# Patient Record
Sex: Female | Born: 1955 | Race: Black or African American | Hispanic: No | Smoking: Former smoker
Health system: Southern US, Community
[De-identification: ages and names within clinical notes are randomized; demographics above are authoritative.]

## PROBLEM LIST (undated history)

## (undated) ENCOUNTER — Ambulatory Visit: Admission: EM | Payer: Medicare PPO

## (undated) DIAGNOSIS — L0390 Cellulitis, unspecified: Secondary | ICD-10-CM

## (undated) DIAGNOSIS — M1991 Primary osteoarthritis, unspecified site: Secondary | ICD-10-CM

## (undated) DIAGNOSIS — F419 Anxiety disorder, unspecified: Secondary | ICD-10-CM

## (undated) DIAGNOSIS — F32A Depression, unspecified: Secondary | ICD-10-CM

## (undated) DIAGNOSIS — A048 Other specified bacterial intestinal infections: Secondary | ICD-10-CM

## (undated) DIAGNOSIS — M1990 Unspecified osteoarthritis, unspecified site: Secondary | ICD-10-CM

## (undated) DIAGNOSIS — I10 Essential (primary) hypertension: Secondary | ICD-10-CM

## (undated) DIAGNOSIS — J45909 Unspecified asthma, uncomplicated: Secondary | ICD-10-CM

## (undated) DIAGNOSIS — E119 Type 2 diabetes mellitus without complications: Secondary | ICD-10-CM

## (undated) DIAGNOSIS — I2510 Atherosclerotic heart disease of native coronary artery without angina pectoris: Secondary | ICD-10-CM

## (undated) DIAGNOSIS — J302 Other seasonal allergic rhinitis: Secondary | ICD-10-CM

## (undated) DIAGNOSIS — D649 Anemia, unspecified: Secondary | ICD-10-CM

## (undated) DIAGNOSIS — J301 Allergic rhinitis due to pollen: Secondary | ICD-10-CM

## (undated) DIAGNOSIS — K219 Gastro-esophageal reflux disease without esophagitis: Secondary | ICD-10-CM

## (undated) DIAGNOSIS — E7800 Pure hypercholesterolemia, unspecified: Secondary | ICD-10-CM

## (undated) HISTORY — PX: APPENDECTOMY: SHX54

## (undated) HISTORY — PX: ABDOMINAL HYSTERECTOMY: SHX81

## (undated) HISTORY — PX: CHOLECYSTECTOMY: SHX55

## (undated) HISTORY — PX: BREAST CYST EXCISION: SHX579

## (undated) HISTORY — PX: LIPOMA EXCISION: SHX5283

---

## 2004-01-14 ENCOUNTER — Other Ambulatory Visit: Payer: Self-pay

## 2005-02-24 ENCOUNTER — Emergency Department: Payer: Self-pay | Admitting: Unknown Physician Specialty

## 2005-03-21 ENCOUNTER — Ambulatory Visit: Payer: Self-pay | Admitting: Physician Assistant

## 2005-03-30 ENCOUNTER — Ambulatory Visit: Payer: Self-pay

## 2005-07-02 ENCOUNTER — Emergency Department: Payer: Self-pay | Admitting: Emergency Medicine

## 2005-07-02 ENCOUNTER — Other Ambulatory Visit: Payer: Self-pay

## 2005-07-04 ENCOUNTER — Ambulatory Visit: Payer: Self-pay | Admitting: Family Medicine

## 2005-08-10 ENCOUNTER — Emergency Department: Payer: Self-pay | Admitting: Unknown Physician Specialty

## 2005-08-10 ENCOUNTER — Ambulatory Visit: Payer: Self-pay | Admitting: Unknown Physician Specialty

## 2005-08-10 ENCOUNTER — Other Ambulatory Visit: Payer: Self-pay

## 2005-08-15 ENCOUNTER — Ambulatory Visit: Payer: Self-pay | Admitting: General Surgery

## 2005-08-17 ENCOUNTER — Ambulatory Visit: Payer: Self-pay | Admitting: General Surgery

## 2007-08-28 ENCOUNTER — Ambulatory Visit: Payer: Self-pay | Admitting: Family Medicine

## 2008-04-26 ENCOUNTER — Emergency Department: Payer: Self-pay | Admitting: Emergency Medicine

## 2008-04-26 ENCOUNTER — Ambulatory Visit: Payer: Self-pay | Admitting: Internal Medicine

## 2010-06-15 ENCOUNTER — Ambulatory Visit: Payer: Self-pay | Admitting: Unknown Physician Specialty

## 2011-08-08 ENCOUNTER — Ambulatory Visit: Payer: Self-pay | Admitting: Family Medicine

## 2012-08-14 ENCOUNTER — Ambulatory Visit: Payer: Self-pay | Admitting: Family Medicine

## 2012-08-16 ENCOUNTER — Ambulatory Visit: Payer: Self-pay | Admitting: Family Medicine

## 2013-12-31 ENCOUNTER — Ambulatory Visit: Payer: Self-pay | Admitting: Family Medicine

## 2015-02-04 ENCOUNTER — Ambulatory Visit: Payer: Self-pay

## 2015-08-30 ENCOUNTER — Ambulatory Visit (INDEPENDENT_AMBULATORY_CARE_PROVIDER_SITE_OTHER)
Admission: EM | Admit: 2015-08-30 | Discharge: 2015-08-30 | Disposition: A | Discharge status: Home / Self Care | Payer: BC Managed Care – PPO | Source: Home / Self Care | Attending: Family Medicine | Admitting: Family Medicine

## 2015-08-30 ENCOUNTER — Encounter: Payer: Self-pay | Admitting: Emergency Medicine

## 2015-08-30 ENCOUNTER — Observation Stay
Admission: EM | Admit: 2015-08-30 | Discharge: 2015-08-31 | Disposition: A | Discharge status: Home / Self Care | Payer: BC Managed Care – PPO | Attending: Internal Medicine | Admitting: Internal Medicine

## 2015-08-30 ENCOUNTER — Emergency Department: Payer: BC Managed Care – PPO

## 2015-08-30 ENCOUNTER — Other Ambulatory Visit: Payer: Self-pay

## 2015-08-30 DIAGNOSIS — Z886 Allergy status to analgesic agent status: Secondary | ICD-10-CM | POA: Diagnosis not present

## 2015-08-30 DIAGNOSIS — R079 Chest pain, unspecified: Secondary | ICD-10-CM

## 2015-08-30 DIAGNOSIS — F419 Anxiety disorder, unspecified: Secondary | ICD-10-CM | POA: Diagnosis not present

## 2015-08-30 DIAGNOSIS — E1165 Type 2 diabetes mellitus with hyperglycemia: Secondary | ICD-10-CM | POA: Insufficient documentation

## 2015-08-30 DIAGNOSIS — Z791 Long term (current) use of non-steroidal anti-inflammatories (NSAID): Secondary | ICD-10-CM | POA: Diagnosis not present

## 2015-08-30 DIAGNOSIS — Z87891 Personal history of nicotine dependence: Secondary | ICD-10-CM | POA: Diagnosis not present

## 2015-08-30 DIAGNOSIS — IMO0002 Reserved for concepts with insufficient information to code with codable children: Secondary | ICD-10-CM | POA: Insufficient documentation

## 2015-08-30 DIAGNOSIS — Z809 Family history of malignant neoplasm, unspecified: Secondary | ICD-10-CM | POA: Diagnosis not present

## 2015-08-30 DIAGNOSIS — I209 Angina pectoris, unspecified: Secondary | ICD-10-CM | POA: Insufficient documentation

## 2015-08-30 DIAGNOSIS — I10 Essential (primary) hypertension: Secondary | ICD-10-CM

## 2015-08-30 DIAGNOSIS — E119 Type 2 diabetes mellitus without complications: Secondary | ICD-10-CM | POA: Diagnosis not present

## 2015-08-30 DIAGNOSIS — E785 Hyperlipidemia, unspecified: Secondary | ICD-10-CM | POA: Insufficient documentation

## 2015-08-30 DIAGNOSIS — Z8639 Personal history of other endocrine, nutritional and metabolic disease: Secondary | ICD-10-CM | POA: Diagnosis not present

## 2015-08-30 DIAGNOSIS — I25119 Atherosclerotic heart disease of native coronary artery with unspecified angina pectoris: Secondary | ICD-10-CM | POA: Diagnosis not present

## 2015-08-30 DIAGNOSIS — Z9049 Acquired absence of other specified parts of digestive tract: Secondary | ICD-10-CM | POA: Diagnosis not present

## 2015-08-30 DIAGNOSIS — R9431 Abnormal electrocardiogram [ECG] [EKG]: Secondary | ICD-10-CM

## 2015-08-30 DIAGNOSIS — Z8249 Family history of ischemic heart disease and other diseases of the circulatory system: Secondary | ICD-10-CM

## 2015-08-30 DIAGNOSIS — Z79899 Other long term (current) drug therapy: Secondary | ICD-10-CM | POA: Diagnosis not present

## 2015-08-30 DIAGNOSIS — Z90710 Acquired absence of both cervix and uterus: Secondary | ICD-10-CM | POA: Diagnosis not present

## 2015-08-30 DIAGNOSIS — Z91013 Allergy to seafood: Secondary | ICD-10-CM | POA: Diagnosis not present

## 2015-08-30 DIAGNOSIS — E876 Hypokalemia: Secondary | ICD-10-CM | POA: Diagnosis not present

## 2015-08-30 HISTORY — DX: Anxiety disorder, unspecified: F41.9

## 2015-08-30 HISTORY — DX: Essential (primary) hypertension: I10

## 2015-08-30 HISTORY — DX: Pure hypercholesterolemia, unspecified: E78.00

## 2015-08-30 HISTORY — DX: Type 2 diabetes mellitus without complications: E11.9

## 2015-08-30 HISTORY — DX: Atherosclerotic heart disease of native coronary artery without angina pectoris: I25.10

## 2015-08-30 LAB — CBC
HCT: 38 % (ref 35.0–47.0)
HEMATOCRIT: 37.7 % (ref 35.0–47.0)
HEMOGLOBIN: 12.3 g/dL (ref 12.0–16.0)
HEMOGLOBIN: 12.4 g/dL (ref 12.0–16.0)
MCH: 28.2 pg (ref 26.0–34.0)
MCH: 28.3 pg (ref 26.0–34.0)
MCHC: 32.5 g/dL (ref 32.0–36.0)
MCHC: 32.9 g/dL (ref 32.0–36.0)
MCV: 86.9 fL (ref 80.0–100.0)
MCV: 86.2 fL (ref 80.0–100.0)
Platelets: 246 10*3/uL (ref 150–440)
Platelets: 253 10*3/uL (ref 150–440)
RBC: 4.38 MIL/uL (ref 3.80–5.20)
RBC: 4.37 MIL/uL (ref 3.80–5.20)
RDW: 15.1 % — AB (ref 11.5–14.5)
RDW: 15.4 % — AB (ref 11.5–14.5)
WBC: 8.9 10*3/uL (ref 3.6–11.0)
WBC: 9.9 10*3/uL (ref 3.6–11.0)

## 2015-08-30 LAB — BASIC METABOLIC PANEL
Anion gap: 8 (ref 5–15)
BUN: 13 mg/dL (ref 6–20)
CALCIUM: 9.4 mg/dL (ref 8.9–10.3)
CO2: 29 mmol/L (ref 22–32)
CREATININE: 0.79 mg/dL (ref 0.44–1.00)
Chloride: 103 mmol/L (ref 101–111)
GFR calc Af Amer: >60 mL/min (ref >60)
GFR calc non Af Amer: >60 mL/min (ref >60)
GLUCOSE: 95 mg/dL (ref 65–99)
Potassium: 3.5 mmol/L (ref 3.5–5.1)
Sodium: 140 mmol/L (ref 135–145)

## 2015-08-30 LAB — CREATININE, SERUM: Creatinine, Ser: 0.76 mg/dL (ref 0.44–1.00)

## 2015-08-30 LAB — TROPONIN I: Troponin I: <0.03 ng/mL (ref <0.031)

## 2015-08-30 LAB — GLUCOSE, CAPILLARY: Glucose-Capillary: 156 mg/dL — ABNORMAL HIGH (ref 65–99)

## 2015-08-30 MED ORDER — POLYETHYLENE GLYCOL 3350 17 G PO PACK: 17.0000 g | PACK | Freq: Every day | ORAL | Status: DC | PRN

## 2015-08-30 MED ORDER — ISOSORBIDE MONONITRATE ER 30 MG PO TB24
30.0000 mg | ORAL_TABLET | Freq: Every day | ORAL | Status: DC
  Administered 2015-08-30: 30 mg via ORAL
  Filled 2015-08-30 (×2): qty 1

## 2015-08-30 MED ORDER — MORPHINE SULFATE (PF) 2 MG/ML IV SOLN: 2.0000 mg | INTRAVENOUS | Status: DC | PRN

## 2015-08-30 MED ORDER — NAPROXEN SODIUM 550 MG PO TABS
550.0000 mg | ORAL_TABLET | Freq: Two times a day (BID) | ORAL | Status: DC | PRN
  Filled 2015-08-30: qty 1

## 2015-08-30 MED ORDER — NITROGLYCERIN 0.4 MG SL SUBL
0.4000 mg | SUBLINGUAL_TABLET | SUBLINGUAL | Status: DC | PRN
Start: 2015-08-30 — End: 2015-08-31

## 2015-08-30 MED ORDER — ONDANSETRON HCL 4 MG PO TABS: 4.0000 mg | ORAL_TABLET | Freq: Four times a day (QID) | ORAL | Status: DC | PRN

## 2015-08-30 MED ORDER — SODIUM CHLORIDE 0.9 % IJ SOLN: 3.0000 mL | INTRAMUSCULAR | Status: DC | PRN

## 2015-08-30 MED ORDER — ATORVASTATIN CALCIUM 20 MG PO TABS
40.0000 mg | ORAL_TABLET | Freq: Every day | ORAL | Status: DC
  Administered 2015-08-30: 40 mg via ORAL
  Filled 2015-08-30: qty 2

## 2015-08-30 MED ORDER — ENOXAPARIN SODIUM 40 MG/0.4ML ~~LOC~~ SOLN: 40.0000 mg | Freq: Every day | SUBCUTANEOUS | Status: DC

## 2015-08-30 MED ORDER — NAPROXEN 500 MG PO TABS
500.0000 mg | ORAL_TABLET | Freq: Two times a day (BID) | ORAL | Status: DC | PRN
  Administered 2015-08-30: 500 mg via ORAL
  Filled 2015-08-30 (×2): qty 1

## 2015-08-30 MED ORDER — CLOPIDOGREL BISULFATE 75 MG PO TABS
75.0000 mg | ORAL_TABLET | Freq: Every day | ORAL | Status: DC
  Administered 2015-08-30: 75 mg via ORAL
  Filled 2015-08-30 (×2): qty 1

## 2015-08-30 MED ORDER — ONDANSETRON HCL 4 MG/2ML IJ SOLN: 4.0000 mg | Freq: Four times a day (QID) | INTRAMUSCULAR | Status: DC | PRN

## 2015-08-30 MED ORDER — INSULIN ASPART 100 UNIT/ML ~~LOC~~ SOLN: 0.0000 [IU] | Freq: Three times a day (TID) | SUBCUTANEOUS | Status: DC

## 2015-08-30 MED ORDER — ACETAMINOPHEN 650 MG RE SUPP: 650.0000 mg | Freq: Four times a day (QID) | RECTAL | Status: DC | PRN

## 2015-08-30 MED ORDER — INSULIN ASPART 100 UNIT/ML ~~LOC~~ SOLN: 0.0000 [IU] | Freq: Every day | SUBCUTANEOUS | Status: DC

## 2015-08-30 MED ORDER — ALPRAZOLAM 0.5 MG PO TABS: 0.5000 mg | ORAL_TABLET | Freq: Three times a day (TID) | ORAL | Status: DC | PRN

## 2015-08-30 MED ORDER — ACETAMINOPHEN 325 MG PO TABS: 650.0000 mg | ORAL_TABLET | Freq: Four times a day (QID) | ORAL | Status: DC | PRN

## 2015-08-30 NOTE — ED Notes (Signed)
MD at bedside. 

## 2015-08-30 NOTE — ED Notes (Signed)
Examined by Dr. Thurmond Butts. To [facility] by POV for further evaluation. Dr. Thurmond Butts called report to [facility] RN-Charge at [facility] ER

## 2015-08-30 NOTE — ED Provider Notes (Signed)
CSN: [id number]     Arrival date & time 08/30/15  1215 History   First MD Initiated Contact with Patient 08/30/15 1246     Chief Complaint  Patient presents with  . Chest Pain   reports yesterday on the way to church she started having left sided chest pain anterior that radiated to her neck. She became diaphoretic and short of breath. She didn't decide to go back home which she rested and started feeling somewhat better. She still felt drained throughout the day yesterday and today. Still having chest pain off and on today as well as fatigue because she contacted her provider's office and they suggested going to an urgent care erroneously or the ED.   (Consider location/radiation/quality/duration/timing/severity/associated sxs/prior Treatment) Patient is a 59 y.o. female presenting with chest pain. The history is provided by the patient and the spouse. No language interpreter was used.  Chest Pain Pain location:  Substernal area and L lateral chest Pain quality: sharp   Pain radiates to:  Neck Pain radiates to the back: no   Pain severity:  Severe Onset quality:  Sudden Timing:  Intermittent Progression:  Partially resolved Chronicity:  New Context: at rest and stress   Relieved by:  Rest Ineffective treatments:  Rest Associated symptoms: heartburn and shortness of breath   Associated symptoms: no abdominal pain, no altered mental status and no cough   Risk factors: diabetes mellitus, high cholesterol, hypertension and obesity     Nurse's notes were reviewed. Patient has risk factors from diabetes borderline, elevated cholesterol and is just been started on cluster medicine, hypertension and a strong family history heart disease. Both her mother and father were diabetic and had heart disease and hypertension in her brother died at 47 from a massive heart attack. She is allergic to aspirin.  Past Medical History  Diagnosis Date  . Coronary artery disease   . Diabetes mellitus without  complication   . Hypertension   . Anxiety   . Hypercholesteremia    Past Surgical History  Procedure Laterality Date  . Abdominal hysterectomy    . Appendectomy    . Cholecystectomy     Family History  Problem Relation Age of Onset  . Cancer Mother   . Cancer Father    Social History  Substance Use Topics  . Smoking status: Former Games developer  . Smokeless tobacco: None  . Alcohol Use: No   OB History    No data available     Review of Systems  Respiratory: Positive for shortness of breath. Negative for cough.   Cardiovascular: Positive for chest pain.  Gastrointestinal: Positive for heartburn. Negative for abdominal pain.  All other systems reviewed and are negative.   Allergies  Aspirin and Shellfish allergy  Home Medications   Prior to Admission medications   Medication Sig Start Date End Date Taking? Authorizing Provider  ALPRAZolam Prudy Feeler) 0.5 MG tablet Take 0.5 mg by mouth at bedtime as needed for anxiety.   Yes Historical Provider, MD  atenolol (TENORMIN) 25 MG tablet Take by mouth daily.   Yes Historical Provider, MD  atorvastatin (LIPITOR) 20 MG tablet Take 20 mg by mouth daily.   Yes Historical Provider, MD  metFORMIN (GLUCOPHAGE) 500 MG tablet Take 500 mg by mouth 2 (two) times daily with a meal.   Yes Historical Provider, MD   Meds Ordered and Administered this Visit  Medications - No data to display  Pulse 53  Temp(Src) 96.9 F (36.1 C) (Tympanic)  Resp 16  Ht  (1.6 m)  Wt 236 lb (107.049 kg)  BMI 41.82 kg/m2  SpO2 97% No data found.   Physical Exam  Constitutional: She is oriented to person, place, and time. She appears well-developed and well-nourished.  HENT:  Head: Normocephalic and atraumatic.  Eyes: Conjunctivae are normal. Pupils are equal, round, and reactive to light.  Neck: Normal range of motion. Neck supple. No tracheal deviation present. No thyromegaly present.  Cardiovascular: Regular rhythm, S1 normal, S2 normal, normal heart  sounds and intact distal pulses.  Bradycardia present.   No murmur heard. Pulmonary/Chest: Effort normal and breath sounds normal. She exhibits tenderness.    Mild chest wall tenderness is present no radiation to the neck occurs with palpation of the chest wall.  Abdominal: Soft. She exhibits no distension. There is no tenderness.  Musculoskeletal: Normal range of motion.  Neurological: She is alert and oriented to person, place, and time.  Skin: Skin is warm and dry. No erythema.  Psychiatric: She has a normal mood and affect.  Vitals reviewed.   ED Course  Procedures (including critical care time)  Labs Review Labs Reviewed - No data to display  Imaging Review No results found.   Visual Acuity Review  Right Eye Distance:   Left Eye Distance:   Bilateral Distance:    Right Eye Near:   Left Eye Near:    Bilateral Near:     ED ECG REPORT   Date: 08/30/2015  EKG Time: 1:31 PM  Rate: 52  Rhythm: sinus bradycardia,  normal EKG, normal sinus rhythm, unchanged from previous tracings, sinus bradycardia  Axis: -15  Intervals:none  ST&T Change: Nonspecific ST changes   Narrative Interpretation:  abnormal EKG with nonspecific ST changes.            MDM  No diagnosis found.   Due to risk factors especially family history and diabetes even though she does have some costochondritis of expected to her and her husband that she will need at least one set cardiac enzymes and more than likely a second set 46 hours later. At her risk factors she should be evaluated in the emergency room. Offered EMS transport but she declined and would rather go by private vehicle with her husband driving. Discussed with Baird Lyons at [facility] ED and informed them of her transfer. Should also be noted patient is allergic to aspirin and no aspirin treatment was initiated.  Hassan Rowan, MD 08/30/15 1341

## 2015-08-30 NOTE — ED Notes (Signed)
Pt presents with left sided chest  Pain started yesterday, denies any shortness. Pt states she got diaphoretic and nausea at time of episode. No acute distress noted at this time.

## 2015-08-30 NOTE — ED Notes (Signed)
Started yesterday with sudden onset with left anterior chest pain radiating to left side of face. States "was really sweaty" and slight SOB. States pain has been fairly constant since. Initially 9/10 and now 1/10.

## 2015-08-30 NOTE — Discharge Instructions (Signed)
Chest Pain (Nonspecific) °It is often hard to give a specific diagnosis for the cause of chest pain. There is always a chance that your pain could be related to something serious, such as a heart attack or a blood clot in the lungs. You need to follow up with your health care provider for further evaluation. °CAUSES  °· Heartburn. °· Pneumonia or bronchitis. °· Anxiety or stress. °· Inflammation around your heart (pericarditis) or lung (pleuritis or pleurisy). °· A blood clot in the lung. °· A collapsed lung (pneumothorax). It can develop suddenly on its own (spontaneous pneumothorax) or from trauma to the chest. °· Shingles infection (herpes zoster virus). °The chest wall is composed of bones, muscles, and cartilage. Any of these can be the source of the pain. °· The bones can be bruised by injury. °· The muscles or cartilage can be strained by coughing or overwork. °· The cartilage can be affected by inflammation and become sore (costochondritis). °DIAGNOSIS  °Lab tests or other studies may be needed to find the cause of your pain. Your health care provider may have you take a test called an ambulatory electrocardiogram (ECG). An ECG records your heartbeat patterns over a 24-hour period. You may also have other tests, such as: °· Transthoracic echocardiogram (TTE). During echocardiography, sound waves are used to evaluate how blood flows through your heart. °· Transesophageal echocardiogram (TEE). °· Cardiac monitoring. This allows your health care provider to monitor your heart rate and rhythm in real time. °· Holter monitor. This is a portable device that records your heartbeat and can help diagnose heart arrhythmias. It allows your health care provider to track your heart activity for several days, if needed. °· Stress tests by exercise or by giving medicine that makes the heart beat faster. °TREATMENT  °· Treatment depends on what may be causing your chest pain. Treatment may include: °· Acid blockers for  heartburn. °· Anti-inflammatory medicine. °· Pain medicine for inflammatory conditions. °· Antibiotics if an infection is present. °· You may be advised to change lifestyle habits. This includes stopping smoking and avoiding alcohol, caffeine, and chocolate. °· You may be advised to keep your head raised (elevated) when sleeping. This reduces the chance of acid going backward from your stomach into your esophagus. °Most of the time, nonspecific chest pain will improve within 2-3 days with rest and mild pain medicine.  °HOME CARE INSTRUCTIONS  °· If antibiotics were prescribed, take them as directed. Finish them even if you start to feel better. °· For the next few days, avoid physical activities that bring on chest pain. Continue physical activities as directed. °· Do not use any tobacco products, including cigarettes, chewing tobacco, or electronic cigarettes. °· Avoid drinking alcohol. °· Only take medicine as directed by your health care provider. °· Follow your health care provider's suggestions for further testing if your chest pain does not go away. °· Keep any follow-up appointments you made. If you do not go to an appointment, you could develop lasting (chronic) problems with pain. If there is any problem keeping an appointment, call to reschedule. °SEEK MEDICAL CARE IF:  °· Your chest pain does not go away, even after treatment. °· You have a rash with blisters on your chest. °· You have a fever. °SEEK IMMEDIATE MEDICAL CARE IF:  °· You have increased chest pain or pain that spreads to your arm, neck, jaw, back, or abdomen. °· You have shortness of breath. °· You have an increasing cough, or you cough   up blood. °· You have severe back or abdominal pain. °· You feel nauseous or vomit. °· You have severe weakness. °· You faint. °· You have chills. °This is an emergency. Do not wait to see if the pain will go away. Get medical help at once. Call your local emergency services (911 in [place] Do not drive  yourself to the hospital. °MAKE SURE YOU:  °· Understand these instructions. °· Will watch your condition. °· Will get help right away if you are not doing well or get worse. °Document Released: 08/30/2005 Document Revised: 11/25/2013 Document Reviewed: 06/25/2008 °Exit[employer]® Patient Information ©2015 [employer]. This information is not intended to replace advice given to you by your health care provider. Make sure you discuss any questions you have with your health care provider. ° °Hypertension °Hypertension is another name for high blood pressure. High blood pressure forces your heart to work harder to pump blood. A blood pressure reading has two numbers, which includes a higher number over a lower number (example: 110/72). °HOME CARE  °· Have your blood pressure rechecked by your doctor. °· Only take medicine as told by your doctor. Follow the directions carefully. The medicine does not work as well if you skip doses. Skipping doses also puts you at risk for problems. °· Do not smoke. °· Monitor your blood pressure at home as told by your doctor. °GET HELP IF: °· You think you are having a reaction to the medicine you are taking. °· You have repeat headaches or feel dizzy. °· You have puffiness (swelling) in your ankles. °· You have trouble with your vision. °GET HELP RIGHT AWAY IF:  °· You get a very bad headache and are confused. °· You feel weak, numb, or faint. °· You get chest or belly (abdominal) pain. °· You throw up (vomit). °· You cannot breathe very well. °MAKE SURE YOU:  °· Understand these instructions. °· Will watch your condition. °· Will get help right away if you are not doing well or get worse. °Document Released: 05/08/2008 Document Revised: 11/25/2013 Document Reviewed: 09/12/2013 °ExitCare® Patient Information ©2015 [employer]. This information is not intended to replace advice given to you by your health care provider. Make sure you discuss any questions you have with your health care  provider. ° °

## 2015-08-30 NOTE — ED Provider Notes (Signed)
Time Seen: Approximately 1710 I have reviewed the triage notes  Chief Complaint: Chest Pain   History of Present Illness: Vanessa Hampton is a 59 y.o. female who presents with substernal chest pain which started yesterday patient states she was on her way to church and had sudden onset of substernal chest discomfort described as sharp with some mild radiation to left side of her jaw. Date she did have some nausea, diaphoresis. She denies any shortness of breath or pleuritic component. Pain was very intense for approximately 15 minutes and started yesterday at approximately 10 AM. She states she's noticed a dull ache behind her chest but otherwise has no complaints at this point. She went to an urgent care and is referred here to the emergency department for further evaluation. She states she had a cardiac evaluation with a stress test which was negative done remotely.   Past Medical History  Diagnosis Date  . Coronary artery disease   . Diabetes mellitus without complication   . Hypertension   . Anxiety   . Hypercholesteremia     There are no active problems to display for this patient.   Past Surgical History  Procedure Laterality Date  . Abdominal hysterectomy    . Appendectomy    . Cholecystectomy      Past Surgical History  Procedure Laterality Date  . Abdominal hysterectomy    . Appendectomy    . Cholecystectomy      Current Outpatient Rx  Name  Route  Sig  Dispense  Refill  . ALPRAZolam (XANAX) 0.5 MG tablet   Oral   Take 0.5 mg by mouth at bedtime as needed for anxiety.         Marland Kitchen atenolol (TENORMIN) 25 MG tablet   Oral   Take by mouth daily.         Marland Kitchen atorvastatin (LIPITOR) 20 MG tablet   Oral   Take 20 mg by mouth daily.         . metFORMIN (GLUCOPHAGE) 500 MG tablet   Oral   Take 500 mg by mouth 2 (two) times daily with a meal.           Allergies:  Aspirin and Shellfish allergy  Family History: Family History  Problem Relation Age of  Onset  . Cancer Mother   . Cancer Father    she states she had a brother that had a heart attack at age 6  Social History: Social History  Substance Use Topics  . Smoking status: Former Games developer  . Smokeless tobacco: None  . Alcohol Use: No     Review of Systems:   10 point review of systems was performed and was otherwise negative:  Constitutional: No fever Eyes: No visual disturbances ENT: No sore throat, ear pain Cardiac: Mild dull ache in the chest Respiratory: No shortness of breath, wheezing, or stridor Abdomen: No abdominal pain, no vomiting, No diarrhea Endocrine: No weight loss, No night sweats Extremities: Mild bilateral peripheral edema, no cyanosis Skin: No rashes, easy bruising Neurologic: No focal weakness, trouble with speech or swollowing Urologic: No dysuria, Hematuria, or urinary frequency   Physical Exam:  ED Triage Vitals  Enc Vitals Group     BP 08/30/15 1411 143/80 mmHg     Pulse Rate 08/30/15 1411 51     Resp 08/30/15 1411 20     Temp 08/30/15 1411 98.9 F (37.2 C)     Temp Source 08/30/15 1411 Oral     SpO2  08/30/15 1411 99 %     Weight 08/30/15 1411 236 lb (107.049 kg)     Height 08/30/15 1411  (1.6 m)     Head Cir --      Peak Flow --      Pain Score 08/30/15 1719 0     Pain Loc --      Pain Edu? --      Excl. in GC? --     General: Awake , Alert , and Oriented times 3; GCS 15 Head: Normal cephalic , atraumatic Eyes: Pupils equal , round, reactive to light Nose/Throat: No nasal drainage, patent upper airway without erythema or exudate.  Neck: Supple, Full range of motion, No anterior adenopathy or palpable thyroid masses Lungs: Clear to ascultation without wheezes , rhonchi, or rales Heart: Regular rate, regular rhythm without murmurs , gallops , or rubs Abdomen: Soft, non tender without rebound, guarding , or rigidity; bowel sounds positive and symmetric in all 4 quadrants. No organomegaly .        Extremities: Bilateral  nonpitting circumferential edema. No clubbing or cyanosis Neurologic: normal ambulation, Motor symmetric without deficits, sensory intact Skin: warm, dry, no rashes No obvious reproducible chest wall pain with direct palpation  Labs:   All laboratory work was reviewed including any pertinent negatives or positives listed below:  Labs Reviewed  CBC - Abnormal; Notable for the following:    RDW 15.1 (*)    All other components within normal limits  BASIC METABOLIC PANEL  TROPONIN I   preliminary laboratory work was reviewed and troponin at this time is negative  EKG: ED ECG REPORT I, Jennye Moccasin, the attending physician, personally viewed and interpreted this ECG.  Date: 08/30/2015 EKG Time: 1407 Rate: 51 Rhythm: normal sinus rhythm QRS Axis: Left axis deviation Intervals: normal ST/T Wave abnormalities: Nonspecific ST and T-wave abnormalities  Conduction Disutrbances: none Narrative Interpretation: No obvious acute ischemic changes    Radiology:  EXAM: CHEST 2 VIEW  COMPARISON: Report of chest x-ray dated 02/29/2004  FINDINGS: There is borderline cardiomegaly with slight tortuosity of the thoracic aorta. Pulmonary vascularity is normal. Lungs are clear. No effusions. No osseous abnormalities.  IMPRESSION: Slight cardiomegaly.      I personally reviewed the radiologic studies   ED Course: Differential includes all life-threatening causes for chest pain. This includes but is not exclusive to acute coronary syndrome, aortic dissection, pulmonary embolism, cardiac tamponade, community-acquired pneumonia, pericarditis, musculoskeletal chest wall pain, etc.  Given the patient's presentation and objective findings at this point she does not appear to be having acute myocardial infarction. However she does have multiple risk factors for cardiovascular disease including a strong family history concerning her brother. Her EKG is not completely normal with some  nonspecific T-wave abnormalities and I reviewed the case with the on call cardiologist. He felt the conservative thing would be to watch the patient overnight and do objective study tomorrow morning. The patient is currently essentially asymptomatic and hemodynamically stable. She has a reaction to aspirin which includes hives so none was administered at this time. Assessment:  Acute unspecified chest pain   Final Clinical Impression:   Final diagnoses:  Chest pain, unspecified chest pain type     Plan:  Inpatient management I reviewed the case with the hospitalist team, further disposition and management depends upon their evaluation.            Jennye Moccasin, MD 08/30/15 [phone]

## 2015-08-30 NOTE — H&P (Signed)
[facility] -  at [facility]   PATIENT NAME: Vanessa Hampton    MR#:  [id number]  DATE OF BIRTH:  [birth year]  DATE OF ADMISSION:  08/30/2015  PRIMARY CARE PHYSICIAN: Gardiner Rhyme, MD   REQUESTING/REFERRING PHYSICIAN: Dr. Lacretia Nicks  CHIEF COMPLAINT:   Chief Complaint  Patient presents with  . Chest Pain    HISTORY OF PRESENT ILLNESS:  Vanessa Hampton  is a 59 y.o. female with a known history of hypertension, diabetes, anxiety, hyperlipidemia, history of coronary artery disease, who presents to the hospital due to chest pain/pressure. Patient says she developed some chest pain while driving to church yesterday. The pain was associated with some diaphoresis, dizziness but no shortness of breath, nausea, vomiting. Patient says her pain did radiate to the jaw at times and has not really gone away since yesterday. She therefore came to the ER for further evaluation. Patient does have history of having similar chest pains a few years back for which she had a stress test done which was negative. Given her significant family history for heart disease she is being admitted to the hospital for further evaluation for chest pain.  PAST MEDICAL HISTORY:   Past Medical History  Diagnosis Date  . Coronary artery disease   . Diabetes mellitus without complication   . Hypertension   . Anxiety   . Hypercholesteremia     PAST SURGICAL HISTORY:   Past Surgical History  Procedure Laterality Date  . Abdominal hysterectomy    . Appendectomy    . Cholecystectomy      SOCIAL HISTORY:   Social History  Substance Use Topics  . Smoking status: Former Games developer  . Smokeless tobacco: Not on file  . Alcohol Use: No    FAMILY HISTORY:   Family History  Problem Relation Age of Onset  . Cancer Mother   . Cancer Father     DRUG ALLERGIES:   Allergies  Allergen Reactions  . Aspirin Hives  . Shellfish Allergy Hives    REVIEW OF SYSTEMS:   Review of  Systems  Constitutional: Positive for diaphoresis. Negative for fever and weight loss.  HENT: Negative for congestion, nosebleeds and tinnitus.   Eyes: Negative for blurred vision, double vision and redness.  Respiratory: Negative for cough, hemoptysis and shortness of breath.   Cardiovascular: Positive for chest pain. Negative for orthopnea, leg swelling and PND.  Gastrointestinal: Positive for nausea. Negative for vomiting, abdominal pain, diarrhea and melena.  Genitourinary: Negative for dysuria, urgency and hematuria.  Musculoskeletal: Negative for joint pain and falls.  Neurological: Negative for dizziness, tingling, sensory change, focal weakness, seizures, weakness and headaches.  Endo/Heme/Allergies: Negative for polydipsia. Does not bruise/bleed easily.  Psychiatric/Behavioral: Negative for depression and memory loss. The patient is not nervous/anxious.     MEDICATIONS AT HOME:   Prior to Admission medications   Medication Sig Start Date End Date Taking? Authorizing Provider  ALPRAZolam Prudy Feeler) 0.5 MG tablet Take 0.5 mg by mouth 3 (three) times daily as needed for anxiety.    Yes Historical Provider, MD  atenolol-chlorthalidone (TENORETIC) 50-25 MG per tablet Take 1 tablet by mouth daily.   Yes Historical Provider, MD  atorvastatin (LIPITOR) 40 MG tablet Take 40 mg by mouth daily.   Yes Historical Provider, MD  metFORMIN (GLUCOPHAGE) 500 MG tablet Take 500 mg by mouth 2 (two) times daily with a meal.   Yes Historical Provider, MD  naproxen sodium (ANAPROX) 220 MG tablet Take 660 mg by mouth 2 (two)  times daily as needed (for headache).   Yes Historical Provider, MD  polyethylene glycol (MIRALAX / GLYCOLAX) packet Take 17 g by mouth daily as needed for mild constipation.   Yes Historical Provider, MD      VITAL SIGNS:  Blood pressure 157/89, pulse 55, temperature 98.9 F (37.2 C), temperature source Oral, resp. rate 17, height  (1.6 m), weight 107.049 kg (236 lb), SpO2 100  %.  PHYSICAL EXAMINATION:  Physical Exam  GENERAL:  59 y.o.-year-old patient lying in the bed with no acute distress.  EYES: Pupils equal, round, reactive to light and accommodation. No scleral icterus. Extraocular muscles intact.  HEENT: Head atraumatic, normocephalic. Oropharynx and nasopharynx clear. No oropharyngeal erythema, moist oral mucosa  NECK:  Supple, no jugular venous distention. No thyroid enlargement, no tenderness.  LUNGS: Normal breath sounds bilaterally, no wheezing, rales, rhonchi. No use of accessory muscles of respiration.  CARDIOVASCULAR: S1, S2 RRR. No murmurs, rubs, gallops, clicks.  ABDOMEN: Soft, nontender, nondistended. Bowel sounds present. No organomegaly or mass.  EXTREMITIES: No pedal edema, cyanosis, or clubbing. + 2 pedal & radial pulses b/l.   NEUROLOGIC: Cranial nerves II through XII are intact. No focal Motor or sensory deficits appreciated b/l PSYCHIATRIC: The patient is alert and oriented x 3. Good affect.  SKIN: No obvious rash, lesion, or ulcer.   LABORATORY PANEL:   CBC  Recent Labs Lab 08/30/15 1416  WBC 8.9  HGB 12.3  HCT 38.0  PLT 246   ------------------------------------------------------------------------------------------------------------------  Chemistries   Recent Labs Lab 08/30/15 1416  NA 140  K 3.5  CL 103  CO2 29  GLUCOSE 95  BUN 13  CREATININE 0.79  CALCIUM 9.4   ------------------------------------------------------------------------------------------------------------------  Cardiac Enzymes  Recent Labs Lab 08/30/15 1416  TROPONINI <0.03   ------------------------------------------------------------------------------------------------------------------  RADIOLOGY:  Dg Chest 2 View  08/30/2015   CLINICAL DATA:  Left-sided chest pain.  EXAM: CHEST  2 VIEW  COMPARISON:  Report of chest x-ray dated 02/29/2004  FINDINGS: There is borderline cardiomegaly with slight tortuosity of the thoracic aorta.  Pulmonary vascularity is normal. Lungs are clear. No effusions. No osseous abnormalities.  IMPRESSION: Slight cardiomegaly.   Electronically Signed   By: Francene Boyers M.D.   On: 08/30/2015 14:32     IMPRESSION AND PLAN:   59 year old female with past history of diabetes, hypertension, anxiety, history of coronary artery disease who presents to the hospital due to chest pain.  #1 chest pain-patient does have significant risk factors given her history of coronary disease, family history and underlying diabetes and hypertension. Her pain does have typical features of angina although her cardiac markers are negative and her EKG shows no acute ST or T-wave changes. -We'll observe overnight on telemetry, cycle cardiac markers, get a nuclear medicine stress test in a.m. -We'll also consult cardiology. -Patient is allergic to aspirin so I will start on Plavix, PRN nitroglycerin, morphine, atorvastatin.  Follow clinically.   #2 diabetes type 2 without complication-hold metformin. -Place on sliding scale insulin.  #3 hypertension- start on some Imdur.  - We'll hold atenolol given her bradycardia.  #4 anxiety-we'll place on when necessary Xanax.   All the records are reviewed and case discussed with ED provider. Management plans discussed with the patient, family and they are in agreement.  CODE STATUS: Full  TOTAL TIME TAKING CARE OF THIS PATIENT: 45 minutes.    Houston Siren M.D on 08/30/2015 at 6:34 PM  Between 7am to 6pm - Pager - [phone]  After  6pm go to [email or web address] - password [email or web address] [facility]  [address]  Hospitalists  Office  [phone]  CC: Primary care physician; Gardiner Rhyme, MD

## 2015-08-31 ENCOUNTER — Observation Stay (HOSPITAL_BASED_OUTPATIENT_CLINIC_OR_DEPARTMENT_OTHER): Payer: BC Managed Care – PPO

## 2015-08-31 ENCOUNTER — Encounter: Payer: Self-pay | Admitting: Radiology

## 2015-08-31 DIAGNOSIS — E785 Hyperlipidemia, unspecified: Secondary | ICD-10-CM | POA: Insufficient documentation

## 2015-08-31 DIAGNOSIS — I209 Angina pectoris, unspecified: Secondary | ICD-10-CM | POA: Insufficient documentation

## 2015-08-31 DIAGNOSIS — R079 Chest pain, unspecified: Principal | ICD-10-CM | POA: Insufficient documentation

## 2015-08-31 DIAGNOSIS — F419 Anxiety disorder, unspecified: Secondary | ICD-10-CM | POA: Insufficient documentation

## 2015-08-31 DIAGNOSIS — I10 Essential (primary) hypertension: Secondary | ICD-10-CM | POA: Insufficient documentation

## 2015-08-31 DIAGNOSIS — Z791 Long term (current) use of non-steroidal anti-inflammatories (NSAID): Secondary | ICD-10-CM | POA: Insufficient documentation

## 2015-08-31 DIAGNOSIS — E1165 Type 2 diabetes mellitus with hyperglycemia: Secondary | ICD-10-CM

## 2015-08-31 DIAGNOSIS — Z886 Allergy status to analgesic agent status: Secondary | ICD-10-CM | POA: Insufficient documentation

## 2015-08-31 DIAGNOSIS — Z9049 Acquired absence of other specified parts of digestive tract: Secondary | ICD-10-CM | POA: Insufficient documentation

## 2015-08-31 DIAGNOSIS — I25119 Atherosclerotic heart disease of native coronary artery with unspecified angina pectoris: Secondary | ICD-10-CM | POA: Insufficient documentation

## 2015-08-31 DIAGNOSIS — Z91013 Allergy to seafood: Secondary | ICD-10-CM | POA: Insufficient documentation

## 2015-08-31 DIAGNOSIS — E119 Type 2 diabetes mellitus without complications: Secondary | ICD-10-CM | POA: Insufficient documentation

## 2015-08-31 DIAGNOSIS — Z90710 Acquired absence of both cervix and uterus: Secondary | ICD-10-CM | POA: Insufficient documentation

## 2015-08-31 DIAGNOSIS — IMO0002 Reserved for concepts with insufficient information to code with codable children: Secondary | ICD-10-CM | POA: Insufficient documentation

## 2015-08-31 DIAGNOSIS — Z79899 Other long term (current) drug therapy: Secondary | ICD-10-CM | POA: Insufficient documentation

## 2015-08-31 DIAGNOSIS — Z809 Family history of malignant neoplasm, unspecified: Secondary | ICD-10-CM | POA: Insufficient documentation

## 2015-08-31 DIAGNOSIS — E876 Hypokalemia: Secondary | ICD-10-CM | POA: Insufficient documentation

## 2015-08-31 DIAGNOSIS — Z87891 Personal history of nicotine dependence: Secondary | ICD-10-CM | POA: Insufficient documentation

## 2015-08-31 LAB — NM MYOCAR MULTI W/SPECT W/WALL MOTION / EF
CHL CUP NUCLEAR SDS: 0
CHL CUP NUCLEAR SSS: 1
CSEPED: 8 min
CSEPHR: 85 %
Estimated workload: 10.1 METS
LV dias vol: 66 mL
LV sys vol: 23 mL
Peak HR: 139 {beats}/min
Rest HR: 65 {beats}/min
SRS: 5
TID: 1.06

## 2015-08-31 LAB — CBC
HCT: 33.7 % — ABNORMAL LOW (ref 35.0–47.0)
Hemoglobin: 11.3 g/dL — ABNORMAL LOW (ref 12.0–16.0)
MCH: 29.1 pg (ref 26.0–34.0)
MCHC: 33.5 g/dL (ref 32.0–36.0)
MCV: 86.8 fL (ref 80.0–100.0)
PLATELETS: 225 10*3/uL (ref 150–440)
RBC: 3.88 MIL/uL (ref 3.80–5.20)
RDW: 15.4 % — AB (ref 11.5–14.5)
WBC: 7.9 10*3/uL (ref 3.6–11.0)

## 2015-08-31 LAB — LIPID PANEL
CHOL/HDL RATIO: 2.9 ratio
Cholesterol: 125 mg/dL (ref 0–200)
HDL: 43 mg/dL (ref >40)
LDL Cholesterol: 70 mg/dL (ref 0–99)
Triglycerides: 59 mg/dL (ref <150)
VLDL: 12 mg/dL (ref 0–40)

## 2015-08-31 LAB — BASIC METABOLIC PANEL
Anion gap: 7 (ref 5–15)
BUN: 16 mg/dL (ref 6–20)
CALCIUM: 8.8 mg/dL — AB (ref 8.9–10.3)
CHLORIDE: 104 mmol/L (ref 101–111)
CO2: 29 mmol/L (ref 22–32)
CREATININE: 0.73 mg/dL (ref 0.44–1.00)
GFR calc non Af Amer: >60 mL/min (ref >60)
Glucose, Bld: 111 mg/dL — ABNORMAL HIGH (ref 65–99)
Potassium: 3.2 mmol/L — ABNORMAL LOW (ref 3.5–5.1)
SODIUM: 140 mmol/L (ref 135–145)

## 2015-08-31 LAB — GLUCOSE, CAPILLARY
GLUCOSE-CAPILLARY: 117 mg/dL — AB (ref 65–99)
GLUCOSE-CAPILLARY: 131 mg/dL — AB (ref 65–99)

## 2015-08-31 LAB — TROPONIN I

## 2015-08-31 MED ORDER — TECHNETIUM TC 99M SESTAMIBI GENERIC - CARDIOLITE
30.0000 | Freq: Once | INTRAVENOUS | Status: AC | PRN
  Administered 2015-08-31: 32.57 via INTRAVENOUS

## 2015-08-31 MED ORDER — NITROGLYCERIN 0.4 MG SL SUBL: 0.4000 mg | SUBLINGUAL_TABLET | SUBLINGUAL | Status: DC | PRN

## 2015-08-31 MED ORDER — TECHNETIUM TC 99M SESTAMIBI - CARDIOLITE
13.0000 | Freq: Once | INTRAVENOUS | Status: AC | PRN
  Administered 2015-08-31: 08:00:00 12.09 via INTRAVENOUS

## 2015-08-31 MED ORDER — POTASSIUM CHLORIDE CRYS ER 20 MEQ PO TBCR: 40.0000 meq | EXTENDED_RELEASE_TABLET | Freq: Once | ORAL | Status: DC

## 2015-08-31 MED ORDER — ENOXAPARIN SODIUM 40 MG/0.4ML ~~LOC~~ SOLN: 40.0000 mg | Freq: Two times a day (BID) | SUBCUTANEOUS | Status: DC

## 2015-08-31 NOTE — Consult Note (Signed)
Cardiology Consultation Note  Patient ID: Vanessa Hampton, MRN: [id number], DOB/AGE: Jul 26, 1956 59 y.o. Admit date: 08/30/2015   Date of Consult: 08/31/2015 Primary Physician: Gardiner Rhyme, MD Primary Cardiologist: New to [facility]  Chief Complaint: Chest pain Reason for Consult: Chest pain  HPI: 59 y.o. female with h/o DM2, HTN, HLD, and anxiety/panic attacks who presented to [facility] on 9/26 with complaints of 20 minutes of left sided chest pain while driving to church on 5/28. She took an anxiety pill and the pain resolved.   She does not have a cardiologist in the outpatient setting. She previously underwent outpatient stress echo testing on 02/27/2014 in the setting chest pain that showed no evidence of ischemia or infarction, normal stress echo. Incidental note is made of mild to moderate concentric left ventricular hypertrophy. She has had intermittent episodes of chest pain over the past one year that she had attributed to both anxiety and gas. TUMs will "take the edge off." Episodes are not occuring with exertion. She was previously a smoker from age 10 to 78 at 1/2-1 ppd. No alcohol or illicit drug abuse. Brother passed from MI at age 62. Mother passed from lung cancer, also had history of HTN. Father passed from liver cancer.   She developed left sided chest pain while driving to church on 4/13 that was associated with diaphoresis and nausea. There was some radiation to her left jaw. Pain lasted "at least 20 minutes." She turned her car around to come back home an took an anti-anxiety pill, which resolved her pain. She has been pain free since.  Upon her arrival to [facility] she was found to have troponin negative x 3, ECG with sinus bradycardia, 52 bpm, right axis deviation, poor R wave progression, nonspecific st/t changes anterolateral leads, TWI leads III, aVF, CXR with slight cardiomegaly, K+ 3.2, hgb 11.2. Her BP has been in the 140's to 150's systolic with heart rate in the 50's. She  is for treadmill Myoview this morning and is chest pain free.       Past Medical History  Diagnosis Date  . Coronary artery disease   . Diabetes mellitus without complication   . Hypertension   . Anxiety   . Hypercholesteremia       Most Recent Cardiac Studies: Stress echo 02/27/2014:  STRESS ECHOCARDIOGRAPHY    Drugs:None  Target Heart Rate:139 bpmMaximum Predicted Heart Rate: 164 bpm     StageDuration (mm:ss)Heart Rate (bpm)BP  [id number]  STAGE 1 3:00 99 /  STAGE 2 3:00131 /  RECOVERY5:40 77 158/78      Stress Duration:6:00 mm:ss  Max Stress H.R.:131 bpmTarget Heart Rate Achieved: Yes  Maximum workload of 10 METS was achieved during exercise  ___________________________________________________________________________________________  WALL SEGMENT CHANGES    RestStress  Anterior Septum:NormalHyperkinetic  Anterior Wall:NormalHyperkinetic   Lateral Wall:NormalHyperkinetic  Posterior Wall:NormalHyperkinetic  Inferior Wall:NormalHyperkinetic  Inferior Septum:NormalHyperkinetic   Apex:NormalHyperkinetic     Resting EF:>55% (Est.) Stress EF: >55% (Est.)  ___________________________________________________________________________________________  ADDITIONAL FINDINGS    Chest Discomfort:None  Arrhythmia:None  Termination Reason:Fatigue   Adverse Effects:None   Complications:None  ______________________________________________________________________________________    STRESS ECG RESULTS     ECG Results:Normal  _______________________________________________________________________________________    ECHOCARDIOGRAPHIC DESCRIPTIONS    LEFT VENTRICLE  Size:Normal  Contraction:Normal   LV Masses:No Masses   IHK:[id number] LVH  Dias.FxClass:Normal    RIGHT VENTRICLE  Size:Normal Free Wall:Normal  Contraction:Normal RV Masses:No mass    PERICARDIUM   Fluid:No effusion    _______________________________________________________________________________________    [facility]  ECHO and OTHER SPECIAL PROCEDURES   Aortic:No AR No AS     Mitral:No MR No MS  MV Inflow E Vel=nm* MV Annulus E'Vel=nm*  E/E'Ratio=nm*    Tricuspid:No TR No TS    Pulmonary:No PR No PS  _______________________________________________________________________________________  ECHOCARDIOGRAPHIC MEASUREMENTS  2D DIMENSIONS  AORTA Values Normal Range MAIN PAValuesNormal Range  Annulus:nm*[2.1 - 2.5] PA Main:nm* [1.5 - 2.1]  Aorta Sin:nm*[2.7 - 3.3]RIGHT VENTRICLE  ST Junction:nm*[2.3 - 2.9] RV Base:nm* [ < 4.2]  Asc.Aorta:2.3 cm [2.3 - 3.1]RV Mid:nm* [ < 3.5]  LEFT VENTRICLERV Length:nm* [ < 8.6]  LVIDd:4.5 cm [3.9 - 5.3]INFERIOR VENA CAVA  LVIDs:3.2 cmMax. IVC:nm* [ <= 2.1]   FS:29.2 % [> 25] Min. IVC:nm*  SWT:1.5 cm [0.5 - 0.9]------------------  PWT:1.4 cm [0.5 - 0.9]nm* - not measured  LEFT ATRIUM    LA Diam:3.4 cm [2.7 - 3.8]  LA A4C Area:nm*[ < 20]  LA Volume:nm*[22 - 52] ________________________________________________________________________________  INTERPRETATION  Normal Stress Echocardiogram  No evidence of ischemia or infarction.    Incidental note is made of mild to moderate concentric left ventricular hypertrophy.   Surgical History:  Past Surgical History  Procedure Laterality Date  . Abdominal hysterectomy    . Appendectomy    . Cholecystectomy       Home Meds: Prior to Admission medications   Medication Sig Start Date End Date Taking? Authorizing Provider  ALPRAZolam Prudy Feeler) 0.5 MG tablet Take 0.5 mg by mouth 3 (three) times daily as needed for anxiety.    Yes Historical Provider, MD  atenolol-chlorthalidone (TENORETIC) 50-25 MG per tablet Take 1 tablet by mouth daily.   Yes Historical Provider, MD  atorvastatin (LIPITOR) 40 MG tablet Take 40 mg by mouth daily.   Yes Historical Provider, MD  metFORMIN (GLUCOPHAGE) 500 MG tablet Take 500 mg by mouth 2 (two) times daily with a meal.   Yes Historical Provider, MD  naproxen sodium (ANAPROX) 220 MG tablet Take 660 mg by mouth 2 (two) times daily as needed (for headache).   Yes Historical Provider, MD  polyethylene glycol (MIRALAX / GLYCOLAX) packet Take 17 g by mouth daily as needed for mild constipation.   Yes Historical Provider, MD    Inpatient Medications:  . atorvastatin  40 mg Oral QHS  . clopidogrel  75 mg Oral Daily  . enoxaparin (LOVENOX) injection  40 mg Subcutaneous QHS  . insulin aspart  0-15 Units Subcutaneous TID WC  . insulin aspart  0-5 Units Subcutaneous QHS  . isosorbide mononitrate  30 mg Oral Daily      Allergies:  Allergies  Allergen Reactions  . Aspirin Hives  . Shellfish Allergy Hives    Social History   Social History  . Marital Status: Married    Spouse Name: N/A  . Number of Children: N/A  . Years of Education: N/A    Occupational History  . Not on file.   Social History Main Topics  . Smoking status: Former Games developer  . Smokeless tobacco: Never Used  . Alcohol Use: No  . Drug Use: No  . Sexual Activity: Not Currently   Other Topics Concern  . Not on file   Social History Narrative     Family History  Problem Relation Age of Onset  . Cancer Mother   . Cancer Father      Review of Systems: Review of Systems  Constitutional: Positive for malaise/fatigue and diaphoresis. Negative for fever, chills and weight loss.  HENT: Negative for congestion.  Eyes: Negative for discharge and redness.  Respiratory: Positive for shortness of breath. Negative for cough, hemoptysis, sputum production and wheezing.   Cardiovascular: Positive for chest pain and leg swelling. Negative for palpitations, orthopnea, claudication and PND.  Gastrointestinal: Positive for nausea. Negative for heartburn, vomiting and abdominal pain.  Musculoskeletal: Negative for falls.  Skin: Negative for rash.  Neurological: Negative for dizziness, sensory change, speech change, focal weakness, loss of consciousness and weakness.  Endo/Heme/Allergies: Does not bruise/bleed easily.  Psychiatric/Behavioral: Negative for substance abuse. The patient is nervous/anxious.   All other systems reviewed and are negative.    Labs:  Recent Labs  08/30/15 1416 08/30/15 2315 08/31/15 0540  TROPONINI <0.03 <0.03 <0.03   Lab Results  Component Value Date   WBC 7.9 08/31/2015   HGB 11.3* 08/31/2015   HCT 33.7* 08/31/2015   MCV 86.8 08/31/2015   PLT 225 08/31/2015    Recent Labs Lab 08/31/15 0540  NA 140  K 3.2*  CL 104  CO2 29  BUN 16  CREATININE 0.73  CALCIUM 8.8*  GLUCOSE 111*   Lab Results  Component Value Date   CHOL 125 08/31/2015   HDL 43 08/31/2015   LDLCALC 70 08/31/2015   TRIG 59 08/31/2015   No results found for: DDIMER  Radiology/Studies:  Dg Chest 2 View  08/30/2015   CLINICAL DATA:  Left-sided  chest pain.  EXAM: CHEST  2 VIEW  COMPARISON:  Report of chest x-ray dated 02/29/2004  FINDINGS: There is borderline cardiomegaly with slight tortuosity of the thoracic aorta. Pulmonary vascularity is normal. Lungs are clear. No effusions. No osseous abnormalities.  IMPRESSION: Slight cardiomegaly.   Electronically Signed   By: Francene Boyers M.D.   On: 08/30/2015 14:32    EKG: sinus bradycardia, 52 bpm, right axis deviation, poor R wave progression, nonspecific st/t changes anterolateral leads, TWI leads III, aVF  Weights: Filed Weights   08/30/15 1411 08/30/15 2204  Weight: 236 lb (107.049 kg) 218 lb 9.6 oz (99.156 kg)     Physical Exam: Blood pressure 120/67, pulse 70, temperature 98.3 F (36.8 C), temperature source Oral, resp. rate 20, height  (1.6 m), weight 218 lb 9.6 oz (99.156 kg), SpO2 93 %. Body mass index is 38.73 kg/(m^2). General: Well developed, well nourished, in no acute distress. Head: Normocephalic, atraumatic, sclera non-icteric, no xanthomas, nares are without discharge.  Neck: Negative for carotid bruits. JVD not elevated. Lungs: Clear bilaterally to auscultation without wheezes, rales, or rhonchi. Breathing is unlabored. Heart: RRR with S1 S2. No murmurs, rubs, or gallops appreciated. Abdomen: Obese, soft, non-tender, non-distended with normoactive bowel sounds. No hepatomegaly. No rebound/guarding. No obvious abdominal masses. Msk:  Strength and tone appear normal for age. Extremities: No clubbing or cyanosis. No edema.  Distal pedal pulses are 2+ and equal bilaterally. Neuro: Alert and oriented X 3. No facial asymmetry. No focal deficit. Moves all extremities spontaneously. Psych:  Responds to questions appropriately with a normal affect.    Assessment and Plan:  59 y.o. female with h/o DM2, HTN, HLD, and anxiety/panic attacks who presented to [facility] on 9/26 with complaints of 20 minutes of left sided chest pain while driving to church on 6/21. She took an  anxiety pill and the pain resolved.  1. Atypical chest pain: -Occuring at rest and relieved with Xanax -Previously underwent stress echo 02/27/2014 that showed no signs of ischemia or infarction, normal stress echo. Incidental finding of hyperkinesis myocardium   -Troponin negative x 3 -There are nonspecific  ECG changes as above -Treadmill Myoview this morning to evaluate for high risk ischemia  -If nuclear stress test is abnormal patient would need to be evaluated further with cardiac cath on 9/28 -If nuclear stress test is normal would pursue other etiologies such as possible pulmonary given her right axis deviation, poor R wave progression, possible GI, vs anxiety  -Question aspirin allergy, if not true allergy would use this over Plavix. Will discuss with patient when she is back from nuc med   2. HLD: -LDL at goal -Continue Lipitor   3. HTN: -Improving -Continue atenolol/chlorthalidone 50/25 mg daily  4. DM2: -Add A1C -Agree with holding metformin in case cardiac cath should be indicated  -SSI per IM  5. Anxiety: -Xanax per home dose    SignedEula Listen, PA-C Pager: [phone] 08/31/2015, 9:16 AM

## 2015-08-31 NOTE — Progress Notes (Signed)
     Vanessa Hampton was admitted to the [facility] on 08/30/2015 for an acute medical condition and is being Discharged on  08/31/2015 . She will need another day for recovery and so advised to stay away from work until then. So please excuse her from work for the above Days. Should be able to return to work without any restrictions from 09/02/15.  Call Enid Baas  MD, [facility] at  [phone] with questions.  Enid Baas M.D on 08/31/2015,at 2:09 PM  [facility] [address] [place] [address]

## 2015-08-31 NOTE — Progress Notes (Signed)
[facility] -  at [facility]   PATIENT NAME: Vanessa Hampton    MR#:  [id number]  DATE OF BIRTH:  1956-03-16  SUBJECTIVE:  CHIEF COMPLAINT:   Chief Complaint  Patient presents with  . Chest Pain   -No chest pain. No history of prior cardiac disease. -Patient has been under stress lately. Also has GI symptoms  REVIEW OF SYSTEMS:  Review of Systems  Constitutional: Negative for fever and chills.  HENT: Negative for ear discharge, ear pain and tinnitus.   Eyes: Negative for blurred vision and double vision.  Respiratory: Negative for cough, hemoptysis, shortness of breath and wheezing.   Cardiovascular: Negative for chest pain, palpitations and PND.  Gastrointestinal: Negative for nausea, vomiting, abdominal pain, diarrhea and constipation.  Genitourinary: Negative for dysuria.  Neurological: Negative for dizziness, sensory change, speech change, focal weakness, seizures and headaches.  Psychiatric/Behavioral: Negative for depression. The patient is not nervous/anxious.     DRUG ALLERGIES:   Allergies  Allergen Reactions  . Aspirin Hives  . Shellfish Allergy Hives    VITALS:  Blood pressure 118/60, pulse 64, temperature 98 F (36.7 C), temperature source Oral, resp. rate 18, height  (1.6 m), weight 99.156 kg (218 lb 9.6 oz), SpO2 98 %.  PHYSICAL EXAMINATION:  Physical Exam  GENERAL:  59 y.o.-year-old patient lying in the bed with no acute distress.  EYES: Pupils equal, round, reactive to light and accommodation. No scleral icterus. Extraocular muscles intact.  HEENT: Head atraumatic, normocephalic. Oropharynx and nasopharynx clear.  NECK:  Supple, no jugular venous distention. No thyroid enlargement, no tenderness.  LUNGS: Normal breath sounds bilaterally, no wheezing, rales,rhonchi or crepitation. No use of accessory muscles of respiration.  CARDIOVASCULAR: S1, S2 normal. No murmurs, rubs, or gallops.  ABDOMEN: Soft, nontender,  nondistended. Bowel sounds present. No organomegaly or mass.  EXTREMITIES: No pedal edema, cyanosis, or clubbing.  NEUROLOGIC: Cranial nerves II through XII are intact. Muscle strength 5/5 in all extremities. Sensation intact. Gait not checked.  PSYCHIATRIC: The patient is alert and oriented x 3.  SKIN: No obvious rash, lesion, or ulcer.    LABORATORY PANEL:   CBC  Recent Labs Lab 08/31/15 0540  WBC 7.9  HGB 11.3*  HCT 33.7*  PLT 225   ------------------------------------------------------------------------------------------------------------------  Chemistries   Recent Labs Lab 08/31/15 0540  NA 140  K 3.2*  CL 104  CO2 29  GLUCOSE 111*  BUN 16  CREATININE 0.73  CALCIUM 8.8*   ------------------------------------------------------------------------------------------------------------------  Cardiac Enzymes  Recent Labs Lab 08/31/15 0540  TROPONINI <0.03   ------------------------------------------------------------------------------------------------------------------  RADIOLOGY:  Dg Chest 2 View  08/30/2015   CLINICAL DATA:  Left-sided chest pain.  EXAM: CHEST  2 VIEW  COMPARISON:  Report of chest x-ray dated 02/29/2004  FINDINGS: There is borderline cardiomegaly with slight tortuosity of the thoracic aorta. Pulmonary vascularity is normal. Lungs are clear. No effusions. No osseous abnormalities.  IMPRESSION: Slight cardiomegaly.   Electronically Signed   By: Francene Boyers M.D.   On: 08/30/2015 14:32    EKG:   Orders placed or performed during the hospital encounter of 08/30/15  . EKG 12-Lead  . EKG 12-Lead  . ED EKG within 10 minutes  . ED EKG within 10 minutes    ASSESSMENT AND PLAN:   59 year old female HTN, DM, Hyperlipidemia, anxiety admitted for chest pain  1. Chest pain- GI symptoms may be - No prior cardiac history. But patient does have family history of heart disease. -Troponins negative.  Monitor on telemetry. -I reviewed and this  morning is negative. So patient will be discharged home.  #2. Hypokalemia-replaced  3. Hypertension-continue home medications at discharge. Patient on atenolol and chlorthalidone.  4. Diabetes mellitus-non-insulin-dependent, continue metformin.  5. Hyperlipidemia-on statin   All the records are reviewed and case discussed with Care Management/Social Workerr. Management plans discussed with the patient, family and they are in agreement.  CODE STATUS: Full code  TOTAL TIME TAKING CARE OF THIS PATIENT: 36 minutes.   POSSIBLE D/C IN 1 DAY, DEPENDING ON CLINICAL CONDITION.   Enid Baas M.D on 08/31/2015 at 1:55 PM  Between 7am to 6pm - Pager - [phone]  After 6pm go to [email or web address] - password [email or web address] [facility]  [place] [facility]  Office  [phone]  CC: Primary care physician; Gardiner Rhyme, MD

## 2015-08-31 NOTE — Discharge Instructions (Signed)
Try tums and over the counter pepcid for any heart burn, reflux symptoms

## 2015-08-31 NOTE — Progress Notes (Signed)
Pt discharged, iv and telemetry removed, reviewed discharge instructions and home meds, rx given, returned home meds, family  At bedside, no questions verbalized, escorted  By volunteer

## 2015-08-31 NOTE — Discharge Summary (Signed)
[facility] - [place] at [facility]   PATIENT NAME: Vanessa Hampton    MR#:  [id number]  DATE OF BIRTH:  February 08, 1956  DATE OF ADMISSION:  08/30/2015 ADMITTING PHYSICIAN: Houston Siren, MD  DATE OF DISCHARGE: 08/31/2015  PRIMARY CARE PHYSICIAN: ORENDORFF,REBECCA, MD    ADMISSION DIAGNOSIS:  Chest pain, unspecified chest pain type [R07.9]  DISCHARGE DIAGNOSIS:  Active Problems:   Chest pain   Angina pectoris   Diabetes type 2, uncontrolled   Anxiety disorder   Hyperlipidemia   Pain in the chest   SECONDARY DIAGNOSIS:   Past Medical History  Diagnosis Date  . Coronary artery disease   . Diabetes mellitus without complication   . Hypertension   . Anxiety   . Hypercholesteremia     HOSPITAL COURSE:   59 year old female HTN, DM, Hyperlipidemia, anxiety admitted for chest pain  1. Chest pain- GI symptoms may be - No prior cardiac history. But patient does have family history of heart disease. -Troponins negative. Monitor on telemetry. -I reviewed and this morning is negative. So patient will be discharged home.  #2. Hypokalemia-replaced  3. Hypertension-continue home medications at discharge. Patient on atenolol and chlorthalidone.  4. Diabetes mellitus-non-insulin-dependent, continue metformin.  5. Hyperlipidemia-on statin  Discharge today  DISCHARGE CONDITIONS:  Stable  CONSULTS OBTAINED:  Treatment Team:  Antonieta Iba, MD  DRUG ALLERGIES:   Allergies  Allergen Reactions  . Aspirin Hives  . Shellfish Allergy Hives    DISCHARGE MEDICATIONS:   Current Discharge Medication List    START taking these medications   Details  nitroGLYCERIN (NITROSTAT) 0.4 MG SL tablet Place 1 tablet (0.4 mg total) under the tongue every 5 (five) minutes as needed for chest pain. Qty: 30 tablet, Refills: 0      CONTINUE these medications which have NOT CHANGED   Details  ALPRAZolam (XANAX) 0.5 MG tablet Take 0.5 mg by mouth 3 (three)  times daily as needed for anxiety.     atenolol-chlorthalidone (TENORETIC) 50-25 MG per tablet Take 1 tablet by mouth daily.    atorvastatin (LIPITOR) 40 MG tablet Take 40 mg by mouth daily.    metFORMIN (GLUCOPHAGE) 500 MG tablet Take 500 mg by mouth 2 (two) times daily with a meal.    naproxen sodium (ANAPROX) 220 MG tablet Take 660 mg by mouth 2 (two) times daily as needed (for headache).    polyethylene glycol (MIRALAX / GLYCOLAX) packet Take 17 g by mouth daily as needed for mild constipation.         DISCHARGE INSTRUCTIONS:   1. PCP follow-up in 2 weeks. 2. Advised to take Tum's or  over-the-counter Pepcid for GI symptoms.  If you experience worsening of your admission symptoms, develop shortness of breath, life threatening emergency, suicidal or homicidal thoughts you must seek medical attention immediately by calling 911 or calling your MD immediately  if symptoms less severe.  You Must read complete instructions/literature along with all the possible adverse reactions/side effects for all the Medicines you take and that have been prescribed to you. Take any new Medicines after you have completely understood and accept all the possible adverse reactions/side effects.   Please note  You were cared for by a hospitalist during your hospital stay. If you have any questions about your discharge medications or the care you received while you were in the hospital after you are discharged, you can call the unit and asked to speak with the hospitalist on call if the hospitalist  that took care of you is not available. Once you are discharged, your primary care physician will handle any further medical issues. Please note that NO REFILLS for any discharge medications will be authorized once you are discharged, as it is imperative that you return to your primary care physician (or establish a relationship with a primary care physician if you do not have one) for your aftercare needs so that  they can reassess your need for medications and monitor your lab values.    Today   CHIEF COMPLAINT:   Chief Complaint  Patient presents with  . Chest Pain    VITAL SIGNS:  Blood pressure 118/60, pulse 64, temperature 98 F (36.7 C), temperature source Oral, resp. rate 18, height  (1.6 m), weight 99.156 kg (218 lb 9.6 oz), SpO2 98 %.  I/O:   Intake/Output Summary (Last 24 hours) at 08/31/15 1405 Last data filed at 08/31/15 0847  Gross per 24 hour  Intake    360 ml  Output    250 ml  Net    110 ml    PHYSICAL EXAMINATION:   Physical Exam  GENERAL:  59 y.o.-year-old patient lying in the bed with no acute distress.  EYES: Pupils equal, round, reactive to light and accommodation. No scleral icterus. Extraocular muscles intact.  HEENT: Head atraumatic, normocephalic. Oropharynx and nasopharynx clear.  NECK:  Supple, no jugular venous distention. No thyroid enlargement, no tenderness.  LUNGS: Normal breath sounds bilaterally, no wheezing, rales,rhonchi or crepitation. No use of accessory muscles of respiration.  CARDIOVASCULAR: S1, S2 normal. No murmurs, rubs, or gallops.  ABDOMEN: Soft, non-tender, non-distended. Bowel sounds present. No organomegaly or mass.  EXTREMITIES: No pedal edema, cyanosis, or clubbing.  NEUROLOGIC: Cranial nerves II through XII are intact. Muscle strength 5/5 in all extremities. Sensation intact. Gait not checked.  PSYCHIATRIC: The patient is alert and oriented x 3.  SKIN: No obvious rash, lesion, or ulcer.   DATA REVIEW:   CBC  Recent Labs Lab 08/31/15 0540  WBC 7.9  HGB 11.3*  HCT 33.7*  PLT 225    Chemistries   Recent Labs Lab 08/31/15 0540  NA 140  K 3.2*  CL 104  CO2 29  GLUCOSE 111*  BUN 16  CREATININE 0.73  CALCIUM 8.8*    Cardiac Enzymes  Recent Labs Lab 08/31/15 0540  TROPONINI <0.03    Microbiology Results  No results found for this or any previous visit.  RADIOLOGY:  Dg Chest 2 View  08/30/2015    CLINICAL DATA:  Left-sided chest pain.  EXAM: CHEST  2 VIEW  COMPARISON:  Report of chest x-ray dated 02/29/2004  FINDINGS: There is borderline cardiomegaly with slight tortuosity of the thoracic aorta. Pulmonary vascularity is normal. Lungs are clear. No effusions. No osseous abnormalities.  IMPRESSION: Slight cardiomegaly.   Electronically Signed   By: Francene Boyers M.D.   On: 08/30/2015 14:32   Nm Myocar Multi W/spect W/wall Motion / Ef  08/31/2015   Pharmacological  myocardial perfusion imaging study with no significant   ischemia Normal wall motion, EF estimated at 69% No EKG changes concerning for ischemia.  Low risk scan  Signed, Dossie Arbour, MD [facility]    EKG:   Orders placed or performed during the hospital encounter of 08/30/15  . EKG 12-Lead  . EKG 12-Lead  . ED EKG within 10 minutes  . ED EKG within 10 minutes      Management plans discussed with the patient, family and  they are in agreement.  CODE STATUS:     Code Status Orders        Start     Ordered   08/30/15 2244  Full code   Continuous     08/30/15 2244      TOTAL TIME TAKING CARE OF THIS PATIENT: 36 minutes.    Enid Baas M.D on 08/31/2015 at 2:05 PM  Between 7am to 6pm - Pager - [phone]  After 6pm go to [email or web address] - password [email or web address] [facility]  [place] [facility]  Office  [phone]  CC: Primary care physician; Gardiner Rhyme, MD

## 2015-08-31 NOTE — Progress Notes (Signed)
Stress test result No significant ischemia Normal ejection fraction, greater than 60% No EKG changes concerning for ischemia Overall a low risk scan  Results were discussed with the patient and her son No further testing at this time. They will call our office if she has recurrent symptoms Recommended potassium supplementation with banana daily or over-the-counter potassium daily Low potassium could cause her presenting symptoms, muscle spasms Likely from chlorthalidone Follow-up BMP with primary care as an outpatient on potassium supplement or banana

## 2015-10-17 ENCOUNTER — Encounter: Payer: Self-pay | Admitting: *Deleted

## 2015-10-17 ENCOUNTER — Ambulatory Visit
Admission: EM | Admit: 2015-10-17 | Discharge: 2015-10-17 | Disposition: A | Discharge status: Home / Self Care | Payer: BC Managed Care – PPO | Attending: Family Medicine | Admitting: Family Medicine

## 2015-10-17 DIAGNOSIS — H8109 Meniere's disease, unspecified ear: Secondary | ICD-10-CM | POA: Diagnosis not present

## 2015-10-17 DIAGNOSIS — R42 Dizziness and giddiness: Secondary | ICD-10-CM

## 2015-10-17 MED ORDER — MECLIZINE HCL 25 MG PO TABS
25.0000 mg | ORAL_TABLET | Freq: Once | ORAL | Status: AC
  Administered 2015-10-17: 25 mg via ORAL

## 2015-10-17 MED ORDER — ONDANSETRON 8 MG PO TBDP: 8.0000 mg | ORAL_TABLET | Freq: Three times a day (TID) | ORAL | Status: DC | PRN

## 2015-10-17 MED ORDER — MECLIZINE HCL 25 MG PO TABS: 25.0000 mg | ORAL_TABLET | Freq: Three times a day (TID) | ORAL | Status: DC | PRN

## 2015-10-17 MED ORDER — ONDANSETRON 8 MG PO TBDP
8.0000 mg | ORAL_TABLET | Freq: Once | ORAL | Status: AC
  Administered 2015-10-17: 8 mg via ORAL

## 2015-10-17 NOTE — ED Provider Notes (Signed)
CSN: [id number]     Arrival date & time 10/17/15  4098 History   First MD Initiated Contact with Patient 10/17/15 1137    Nurses notes were reviewed. Chief Complaint  Patient presents with  . Dizziness    Patient reports this morning, very lightheaded dizzy. She states when she raises her head up she becomes lightheaded dizzy she felt like she was on follow-up in the car on the way here. States that she is STILL symptoms have gotten better.   She was seen here several weeks ago because of chest pain she wanted up being transferred [facility] where she stayed for about 2 days and underwent cardiac evaluation and was also found to have an ulcer with endoscopy and to have been hypokalemic and they felt that was some problems with the chest discomfort that she may have had as well.  (Consider location/radiation/quality/duration/timing/severity/associated sxs/prior Treatment) Patient is a 59 y.o. female presenting with dizziness.  Dizziness Quality:  Vertigo Severity:  Moderate Onset quality:  Sudden Timing:  Rare Progression:  Waxing and waning Chronicity:  New Context: head movement and standing up   Relieved by:  Being still Worsened by:  Movement and turning head Associated symptoms: headaches, nausea and vomiting   Associated symptoms: no chest pain and no shortness of breath   Risk factors: multiple medications   Risk factors: no anemia     Past Medical History  Diagnosis Date  . Coronary artery disease   . Diabetes mellitus without complication (HCC)   . Hypertension   . Anxiety   . Hypercholesteremia    Past Surgical History  Procedure Laterality Date  . Abdominal hysterectomy    . Appendectomy    . Cholecystectomy     Family History  Problem Relation Age of Onset  . Cancer Mother   . Cancer Father    Social History  Substance Use Topics  . Smoking status: Former Games developer  . Smokeless tobacco: Never Used  . Alcohol Use: No   OB History    No data available      Review of Systems  Respiratory: Negative for shortness of breath.   Cardiovascular: Negative for chest pain.  Gastrointestinal: Positive for nausea and vomiting.  Neurological: Positive for dizziness and headaches.    Allergies  Aspirin and Shellfish allergy  Home Medications   Prior to Admission medications   Medication Sig Start Date End Date Taking? Authorizing Provider  amoxicillin (AMOXIL) 500 MG tablet Take 500 mg by mouth 2 (two) times daily.   Yes Historical Provider, MD  clarithromycin (BIAXIN) 500 MG tablet Take 500 mg by mouth 2 (two) times daily.   Yes Historical Provider, MD  omeprazole (PRILOSEC) 20 MG capsule Take 20 mg by mouth 2 (two) times daily before a meal.   Yes Historical Provider, MD  ALPRAZolam (XANAX) 0.5 MG tablet Take 0.5 mg by mouth 3 (three) times daily as needed for anxiety.     Historical Provider, MD  atenolol-chlorthalidone (TENORETIC) 50-25 MG per tablet Take 1 tablet by mouth daily.    Historical Provider, MD  atorvastatin (LIPITOR) 40 MG tablet Take 40 mg by mouth daily.    Historical Provider, MD  metFORMIN (GLUCOPHAGE) 500 MG tablet Take 500 mg by mouth 2 (two) times daily with a meal.    Historical Provider, MD  naproxen sodium (ANAPROX) 220 MG tablet Take 660 mg by mouth 2 (two) times daily as needed (for headache).    Historical Provider, MD  nitroGLYCERIN (NITROSTAT) 0.4 MG  SL tablet Place 1 tablet (0.4 mg total) under the tongue every 5 (five) minutes as needed for chest pain. 08/31/15   Enid Baasadhika Kalisetti, MD  polyethylene glycol (MIRALAX / GLYCOLAX) packet Take 17 g by mouth daily as needed for mild constipation.    Historical Provider, MD   Meds Ordered and Administered this Visit   Medications  ondansetron (ZOFRAN-ODT) disintegrating tablet 8 mg (8 mg Oral Given 10/17/15 1306)  meclizine (ANTIVERT) tablet 25 mg (25 mg Oral Given 10/17/15 1306)    BP 128/65 mmHg  Pulse 59  Temp(Src) 98 F (36.7 C) (Oral)  Resp 20  Ht 5\' 3"  (1.6 m)   Wt 215 lb (97.523 kg)  BMI 38.09 kg/m2  SpO2 98% No data found.   Physical Exam  Constitutional: She is oriented to person, place, and time. She appears well-developed and well-nourished.  HENT:  Head: Normocephalic and atraumatic.  Right Ear: Hearing, tympanic membrane, external ear and ear canal normal.  Left Ear: Tympanic membrane, external ear and ear canal normal.  Nose: Nose normal.  Mouth/Throat: Uvula is midline. Normal dentition. No dental caries. No oropharyngeal exudate or posterior oropharyngeal edema.  Eyes: Pupils are equal, round, and reactive to light.  Neck: Normal range of motion. Neck supple. No thyromegaly present.  Cardiovascular: Normal rate, regular rhythm and normal heart sounds.   Pulmonary/Chest: Effort normal.  Musculoskeletal: Normal range of motion.  Lymphadenopathy:    She has no cervical adenopathy.  Neurological: She is alert and oriented to person, place, and time.    ED Course  Procedures (including critical care time)  Labs Review Labs Reviewed - No data to display  Imaging Review No results found.   Visual Acuity Review  Right Eye Distance:   Left Eye Distance:   Bilateral Distance:    Right Eye Near:   Left Eye Near:    Bilateral Near:         MDM   1. Meniere disease, unspecified laterality   2. Vertigo     Patient underwent rotation of her head with increased symptoms clockwise rotation and improvement counterclockwise rotation. Will give a patient dose of Zofran 8 mg here and some Antivert 25 mg by mouth and was sent home on those above medications. Discussed with her husband since she did improve with the treatment and she had extensive workup in the last 30 days finding hypokalemia causing chest pain upper endoscopy with scope revealed an ulcer and she is on multiple antibiotics and GI medications right now we'll plan to do much further workup at this time unless needed. She husband are in agreement with that. She does  report improvement with the maneuver Zofran and Antivert given here.  Hassan RowanEugene Joud Pettinato, MD 10/17/15 [phone]

## 2015-10-17 NOTE — ED Notes (Signed)
Dizzy and nauseated since last PM. Dizziness came on suddenly, nausea, the result of the dizziness.

## 2015-10-17 NOTE — Discharge Instructions (Signed)
Benign Positional Vertigo °Vertigo is the feeling that you or your surroundings are moving when they are not. Benign positional vertigo is the most common form of vertigo. The cause of this condition is not serious (is benign). This condition is triggered by certain movements and positions (is positional). This condition can be dangerous if it occurs while you are doing something that could endanger you or others, such as driving.  °CAUSES °In many cases, the cause of this condition is not known. It may be caused by a disturbance in an area of the inner ear that helps your brain to sense movement and balance. This disturbance can be caused by a viral infection (labyrinthitis), head injury, or repetitive motion. °RISK FACTORS °This condition is more likely to develop in: °· Women. °· People who are 50 years of age or older. °SYMPTOMS °Symptoms of this condition usually happen when you move your head or your eyes in different directions. Symptoms may start suddenly, and they usually last for less than a minute. Symptoms may include: °· Loss of balance and falling. °· Feeling like you are spinning or moving. °· Feeling like your surroundings are spinning or moving. °· Nausea and vomiting. °· Blurred vision. °· Dizziness. °· Involuntary eye movement (nystagmus). °Symptoms can be mild and cause only slight annoyance, or they can be severe and interfere with daily life. Episodes of benign positional vertigo may return (recur) over time, and they may be triggered by certain movements. Symptoms may improve over time. °DIAGNOSIS °This condition is usually diagnosed by medical history and a physical exam of the head, neck, and ears. You may be referred to a health care provider who specializes in ear, nose, and throat (ENT) problems (otolaryngologist) or a provider who specializes in disorders of the nervous system (neurologist). You may have additional testing, including: °· MRI. °· A CT scan. °· Eye movement tests. Your  health care provider may ask you to change positions quickly while he or she watches you for symptoms of benign positional vertigo, such as nystagmus. Eye movement may be tested with an electronystagmogram (ENG), caloric stimulation, the Dix-Hallpike test, or the roll test. °· An electroencephalogram (EEG). This records electrical activity in your brain. °· Hearing tests. °TREATMENT °Usually, your health care provider will treat this by moving your head in specific positions to adjust your inner ear back to normal. Surgery may be needed in severe cases, but this is rare. In some cases, benign positional vertigo may resolve on its own in 2-4 weeks. °HOME CARE INSTRUCTIONS °Safety °· Move slowly. Avoid sudden body or head movements. °· Avoid driving. °· Avoid operating heavy machinery. °· Avoid doing any tasks that would be dangerous to you or others if a vertigo episode would occur. °· If you have trouble walking or keeping your balance, try using a cane for stability. If you feel dizzy or unstable, sit down right away. °· Return to your normal activities as told by your health care provider. Ask your health care provider what activities are safe for you. °General Instructions °· Take over-the-counter and prescription medicines only as told by your health care provider. °· Avoid certain positions or movements as told by your health care provider. °· Drink enough fluid to keep your urine clear or pale yellow. °· Keep all follow-up visits as told by your health care provider. This is important. °SEEK MEDICAL CARE IF: °· You have a fever. °· Your condition gets worse or you develop new symptoms. °· Your family or friends   notice any behavioral changes.  Your nausea or vomiting gets worse.  You have numbness or a "pins and needles" sensation. SEEK IMMEDIATE MEDICAL CARE IF:  You have difficulty speaking or moving.  You are always dizzy.  You faint.  You develop severe headaches.  You have weakness in your  legs or arms.  You have changes in your hearing or vision.  You develop a stiff neck.  You develop sensitivity to light.   This information is not intended to replace advice given to you by your health care provider. Make sure you discuss any questions you have with your health care provider.   Document Released: 08/28/2006 Document Revised: 08/11/2015 Document Reviewed: 03/15/2015 Elsevier Interactive Patient Education 2016 Else[employer] Inc.  Dizziness Dizziness is a common problem. It makes you feel unsteady or lightheaded. You may feel like you are about to pass out (faint). Dizziness can lead to injury if you stumble or fall. Anyone can get dizzy, but dizziness is more common in older adults. This condition can be caused by a number of things, including:  Medicines.  Dehydration.  Illness. HOME CARE Following these instructions may help with your condition: Eating and Drinking  Drink enough fluid to keep your pee (urine) clear or pale yellow. This helps to keep you from getting dehydrated. Try to drink more clear fluids, such as water.  Do not drink alcohol.  Limit how much caffeine you drink or eat if told by your doctor.  Limit how much salt you drink or eat if told by your doctor. Activity  Avoid making quick movements.  When you stand up from sitting in a chair, steady yourself until you feel okay.  In the morning, first sit up on the side of the bed. When you feel okay, stand slowly while you hold onto something. Do this until you know that your balance is fine.  Move your legs often if you need to stand in one place for a long time. Tighten and relax your muscles in your legs while you are standing.  Do not drive or use heavy machinery if you feel dizzy.  Avoid bending down if you feel dizzy. Place items in your home so that they are easy for you to reach without leaning over. Lifestyle  Do not use any tobacco products, including cigarettes, chewing tobacco, or  electronic cigarettes. If you need help quitting, ask your doctor.  Try to lower your stress level, such as with yoga or meditation. Talk with your doctor if you need help. General Instructions  Watch your dizziness for any changes.  Take medicines only as told by your doctor. Talk with your doctor if you think that your dizziness is caused by a medicine that you are taking.  Tell a friend or a family member that you are feeling dizzy. If he or she notices any changes in your behavior, have this person call your doctor.  Keep all follow-up visits as told by your doctor. This is important. GET HELP IF:  Your dizziness does not go away.  Your dizziness or light-headedness gets worse.  You feel sick to your stomach (nauseous).  You have trouble hearing.  You have new symptoms.  You are unsteady on your feet or you feel like the room is spinning. GET HELP RIGHT AWAY IF:  You throw up (vomit) or have diarrhea and are unable to eat or drink anything.  You have trouble:  Talking.  Walking.  Swallowing.  Using your arms, hands, or legs.  You feel generally weak.  You are not thinking clearly or you have trouble forming sentences. It may take a friend or family member to notice this.  You have:  Chest pain.  Pain in your belly (abdomen).  Shortness of breath.  Sweating.  Your vision changes.  You are bleeding.  You have a headache.  You have neck pain or a stiff neck.  You have a fever.   This information is not intended to replace advice given to you by your health care provider. Make sure you discuss any questions you have with your health care provider.   Document Released: 11/09/2011 Document Revised: 04/06/2015 Document Reviewed: 11/16/2014 Elsevier Interactive Patient Education 2016 Elsevier Inc.  Meniere Disease Meniere disease is an inner ear disorder. It causes attacks of a spinning sensation (vertigo) and ringing in the ear (tinnitus). It also  causes hearing loss and a sensation of fullness or pressure in your ear.  Meniere disease is lifelong. It may get worse over time. Symptoms usually begin in one ear but may eventually affect both ears.  CAUSES Meniere disease is caused by having too much of the fluid that is in your inner ear (endolymph). When endolymph builds up in your inner ear, it affects the nerves that control balance and hearing. The reason for the endolymph buildup is not known. Possible causes include:  Allergy.  An abnormal reaction of the body's defense system (autoimmune disease).  Viral infection of the inner ear.  Head injury. RISK FACTORS  Age older than 40 years.  Family history of Meniere disease.  History of autoimmune disease.  History of migraine headaches. SIGNS AND SYMPTOMS Symptoms of Meniere disease can come and go and may last for up to 4 hours at a time. Symptoms usually start in one ear and may become more frequent and eventually involve both ears. Symptoms can include:  Fullness and pressure in your ear.  Roaring or ringing in your ear.  Vertigo and loss of balance.  Decreased hearing.  Nausea and vomiting. DIAGNOSIS Your health care provider will perform a physical exam. Tests may be done to confirm a diagnosis of Meniere disease. These tests may include:  A hearing test (audiogram).  An electronystagmogram. This tests your balance nerve (vestibular nerve).  Imaging studies, such as CT or MRI, of your inner ear. TREATMENT There is no cure for Meniere disease, but it can be managed. Management may include:  A diet that may help relieve symptoms of Meniere disease.  Use of medicines to reduce:  Vertigo.  Nausea.  Fluid retention.  Use of an air pressure pulse generator. This is a machine that sends small pressure pulses into your ear canal.  Inner ear surgery (rare). When you experience symptoms, it can be helpful to lie down on a flat surface and focus your eyes on  one object that does not move. Try to stay in that position until your symptoms go away.  HOME CARE INSTRUCTIONS   Take medicines only as directed by your health care provider.  Eat the same amount of food at the same time every day, including snacks.  Do not skip meals.  Limit the salt in your diet to 1,000 mg a day.  Avoid caffeine.  Limit alcoholic drinks to one drink a day.  Do not eat foods containing monosodium glutamate (MSG).  Drink enough fluids to keep your urine clear or pale yellow.  Do not use any tobacco products including cigarettes, chewing tobacco, or electronic cigarettes. If  you need help quitting, ask your health care provider.  Find ways to reduce or avoid stress. SEEK MEDICAL CARE IF:   You have symptoms that last longer than 4 hours.  You have new or more severe symptoms. SEEK IMMEDIATE MEDICAL CARE IF:   You have been vomiting for 24 hours.  You are not able to keep fluids down.  You have chest pain or trouble breathing.   This information is not intended to replace advice given to you by your health care provider. Make sure you discuss any questions you have with your health care provider.   Document Released: 11/17/2000 Document Revised: 12/11/2014 Document Reviewed: 11/03/2013 Elsevier Interactive Patient Education [employer]2016 Elsevier Inc.

## 2015-11-19 ENCOUNTER — Ambulatory Visit
Admission: EM | Admit: 2015-11-19 | Discharge: 2015-11-19 | Disposition: A | Discharge status: Home / Self Care | Payer: BC Managed Care – PPO | Attending: Family Medicine | Admitting: Family Medicine

## 2015-11-19 DIAGNOSIS — L03116 Cellulitis of left lower limb: Secondary | ICD-10-CM

## 2015-11-19 HISTORY — DX: Cellulitis, unspecified: L03.90

## 2015-11-19 MED ORDER — CEPHALEXIN 500 MG PO CAPS
500.0000 mg | ORAL_CAPSULE | Freq: Three times a day (TID) | ORAL | Status: DC
Start: 2015-11-19 — End: 2016-12-30

## 2015-11-19 MED ORDER — CEFAZOLIN SODIUM 1 G IJ SOLR
1.0000 g | Freq: Once | INTRAMUSCULAR | Status: AC
  Administered 2015-11-19: 1 g via INTRAMUSCULAR

## 2015-11-19 NOTE — ED Provider Notes (Signed)
CSN: [id number]     Arrival date & time 11/19/15  1436 History   First MD Initiated Contact with Patient 11/19/15 1630     Chief Complaint  Patient presents with  . Cellulitis   (Consider location/radiation/quality/duration/timing/severity/associated sxs/prior Treatment) HPI Comments: 59 yo diabetic female with a 1 day h/o left leg skin redness, swelling, and warmth. States not sure if was bitten by an insect but does have a small blister on the area. Denies fevers, chills. Also states recently had cellulitis in her other leg and is just finishing Bactrim (1 more day).   The history is provided by the patient.    Past Medical History  Diagnosis Date  . Coronary artery disease   . Diabetes mellitus without complication (HCC)   . Hypertension   . Anxiety   . Hypercholesteremia   . Cellulitis    Past Surgical History  Procedure Laterality Date  . Abdominal hysterectomy    . Appendectomy    . Cholecystectomy     Family History  Problem Relation Age of Onset  . Cancer Mother   . Cancer Father    Social History  Substance Use Topics  . Smoking status: Former Games developermoker  . Smokeless tobacco: Never Used  . Alcohol Use: No   OB History    No data available     Review of Systems  Allergies  Aspirin and Shellfish allergy  Home Medications   Prior to Admission medications   Medication Sig Start Date End Date Taking? Authorizing Provider  ALPRAZolam Prudy Feeler(XANAX) 0.5 MG tablet Take 0.5 mg by mouth 3 (three) times daily as needed for anxiety.    Yes Historical Provider, MD  atenolol-chlorthalidone (TENORETIC) 50-25 MG per tablet Take 1 tablet by mouth daily.   Yes Historical Provider, MD  atorvastatin (LIPITOR) 40 MG tablet Take 40 mg by mouth daily.   Yes Historical Provider, MD  meclizine (ANTIVERT) 25 MG tablet Take 1 tablet (25 mg total) by mouth 3 (three) times daily as needed for dizziness. 10/17/15  Yes Hassan RowanEugene Wade, MD  metFORMIN (GLUCOPHAGE) 500 MG tablet Take 500 mg by mouth  2 (two) times daily with a meal.   Yes Historical Provider, MD  omeprazole (PRILOSEC) 20 MG capsule Take 20 mg by mouth 2 (two) times daily before a meal.   Yes Historical Provider, MD  polyethylene glycol (MIRALAX / GLYCOLAX) packet Take 17 g by mouth daily as needed for mild constipation.   Yes Historical Provider, MD  sulfamethoxazole-trimethoprim (BACTRIM DS,SEPTRA DS) 800-160 MG tablet Take 1 tablet by mouth 2 (two) times daily.   Yes Historical Provider, MD  amoxicillin (AMOXIL) 500 MG tablet Take 500 mg by mouth 2 (two) times daily.    Historical Provider, MD  cephALEXin (KEFLEX) 500 MG capsule Take 1 capsule (500 mg total) by mouth 3 (three) times daily. 11/19/15   Vanessa Mccallumrlando Linwood Gullikson, MD  clarithromycin (BIAXIN) 500 MG tablet Take 500 mg by mouth 2 (two) times daily.    Historical Provider, MD  naproxen sodium (ANAPROX) 220 MG tablet Take 660 mg by mouth 2 (two) times daily as needed (for headache).    Historical Provider, MD  nitroGLYCERIN (NITROSTAT) 0.4 MG SL tablet Place 1 tablet (0.4 mg total) under the tongue every 5 (five) minutes as needed for chest pain. 08/31/15   Enid Baasadhika Kalisetti, MD  ondansetron (ZOFRAN ODT) 8 MG disintegrating tablet Take 1 tablet (8 mg total) by mouth every 8 (eight) hours as needed for nausea or vomiting. 10/17/15  Hassan Rowan, MD   Meds Ordered and Administered this Visit   Medications  ceFAZolin (ANCEF) injection 1 g (1 g Intramuscular Given 11/19/15 1716)    BP 144/93 mmHg  Pulse 60  Temp(Src) 97.4 F (36.3 C) (Tympanic)  Resp 17  Ht  (1.6 m)  Wt 209 lb (94.802 kg)  BMI 37.03 kg/m2  SpO2 100% No data found.   Physical Exam  Constitutional: She appears well-developed and well-nourished. No distress.  Skin: She is not diaphoretic.  Left lower extremity neurovascularly intact; normal ROM; area of approx. 12x9cm of blanchable erythema, warmth and tenderness to palpation with a pinpoint puncture wound area with a pinpoint size blister, over the  left calf skin  Nursing note and vitals reviewed.   ED Course  Procedures (including critical care time)  Labs Review Labs Reviewed - No data to display  Imaging Review No results found.   Visual Acuity Review  Right Eye Distance:   Left Eye Distance:   Bilateral Distance:    Right Eye Near:   Left Eye Near:    Bilateral Near:         MDM   1. Cellulitis of left lower extremity    New Prescriptions   CEPHALEXIN (KEFLEX) 500 MG CAPSULE    Take 1 capsule (500 mg total) by mouth 3 (three) times daily.    1. diagnosis reviewed with patient 2. rx as per orders above; reviewed possible side effects, interactions, risks and benefits  3. Recommend supportive treatment with rest, elevation of extremity and warm compresses to affected area 4. Follow-up prn if symptoms worsen or don't improve  Vanessa Mccallum, MD 11/19/15 1740

## 2015-11-19 NOTE — ED Notes (Signed)
Started last night with "tingly/burning" sensation posterior left calf. Now red, swollen and hot to touch 7 inches x 10 inches.

## 2016-03-04 ENCOUNTER — Other Ambulatory Visit: Payer: Self-pay

## 2016-03-04 ENCOUNTER — Encounter: Payer: Self-pay | Admitting: Emergency Medicine

## 2016-03-04 ENCOUNTER — Emergency Department: Payer: BC Managed Care – PPO

## 2016-03-04 DIAGNOSIS — R61 Generalized hyperhidrosis: Secondary | ICD-10-CM | POA: Insufficient documentation

## 2016-03-04 DIAGNOSIS — R109 Unspecified abdominal pain: Secondary | ICD-10-CM | POA: Insufficient documentation

## 2016-03-04 DIAGNOSIS — Z79899 Other long term (current) drug therapy: Secondary | ICD-10-CM | POA: Diagnosis not present

## 2016-03-04 DIAGNOSIS — I2510 Atherosclerotic heart disease of native coronary artery without angina pectoris: Secondary | ICD-10-CM | POA: Diagnosis not present

## 2016-03-04 DIAGNOSIS — Z7984 Long term (current) use of oral hypoglycemic drugs: Secondary | ICD-10-CM | POA: Insufficient documentation

## 2016-03-04 DIAGNOSIS — R079 Chest pain, unspecified: Secondary | ICD-10-CM | POA: Insufficient documentation

## 2016-03-04 DIAGNOSIS — I10 Essential (primary) hypertension: Secondary | ICD-10-CM | POA: Diagnosis not present

## 2016-03-04 DIAGNOSIS — Z792 Long term (current) use of antibiotics: Secondary | ICD-10-CM | POA: Diagnosis not present

## 2016-03-04 DIAGNOSIS — Z87891 Personal history of nicotine dependence: Secondary | ICD-10-CM | POA: Insufficient documentation

## 2016-03-04 DIAGNOSIS — E119 Type 2 diabetes mellitus without complications: Secondary | ICD-10-CM | POA: Diagnosis not present

## 2016-03-04 DIAGNOSIS — R0602 Shortness of breath: Secondary | ICD-10-CM | POA: Insufficient documentation

## 2016-03-04 LAB — CBC
HCT: 36.7 % (ref 35.0–47.0)
Hemoglobin: 12.2 g/dL (ref 12.0–16.0)
MCH: 28.6 pg (ref 26.0–34.0)
MCHC: 33.2 g/dL (ref 32.0–36.0)
MCV: 86.4 fL (ref 80.0–100.0)
PLATELETS: 265 10*3/uL (ref 150–440)
RBC: 4.25 MIL/uL (ref 3.80–5.20)
RDW: 13.8 % (ref 11.5–14.5)
WBC: 8.3 10*3/uL (ref 3.6–11.0)

## 2016-03-04 NOTE — ED Notes (Signed)
Patient with complaint of central chest pain that started a couple of hours ago. Patient reports that it radiated to her left jaw. Patient also reports diaphoresis.

## 2016-03-05 ENCOUNTER — Emergency Department
Admission: EM | Admit: 2016-03-05 | Discharge: 2016-03-05 | Disposition: A | Discharge status: Home / Self Care | Payer: BC Managed Care – PPO | Attending: Emergency Medicine | Admitting: Emergency Medicine

## 2016-03-05 DIAGNOSIS — R079 Chest pain, unspecified: Secondary | ICD-10-CM

## 2016-03-05 LAB — BASIC METABOLIC PANEL
Anion gap: 3 — ABNORMAL LOW (ref 5–15)
BUN: 17 mg/dL (ref 6–20)
CALCIUM: 9.5 mg/dL (ref 8.9–10.3)
CO2: 30 mmol/L (ref 22–32)
CREATININE: 0.81 mg/dL (ref 0.44–1.00)
Chloride: 105 mmol/L (ref 101–111)
GFR calc non Af Amer: >60 mL/min (ref >60)
GLUCOSE: 103 mg/dL — AB (ref 65–99)
Potassium: 3.2 mmol/L — ABNORMAL LOW (ref 3.5–5.1)
Sodium: 138 mmol/L (ref 135–145)

## 2016-03-05 LAB — TROPONIN I: Troponin I: <0.03 ng/mL (ref <0.031)

## 2016-03-05 MED ORDER — NITROGLYCERIN 0.4 MG SL SUBL
0.4000 mg | SUBLINGUAL_TABLET | SUBLINGUAL | Status: DC | PRN
  Administered 2016-03-05: 0.4 mg via SUBLINGUAL
  Filled 2016-03-05: qty 1

## 2016-03-05 MED ORDER — GI COCKTAIL ~~LOC~~
30.0000 mL | Freq: Once | ORAL | Status: AC
  Administered 2016-03-05: 30 mL via ORAL
  Filled 2016-03-05: qty 30

## 2016-03-05 NOTE — ED Provider Notes (Signed)
[facility] Emergency Department Provider Note  ____________________________________________  Time seen: Approximately 131 AM  I have reviewed the triage vital signs and the nursing notes.   HISTORY  Chief Complaint Chest Pain    HPI Vanessa Hampton is a 60 y.o. female who comes into the hospital today with chest pain. The patient reports that she has some mid chest pain that she developed while she was laying across the bed. She reports that the pain did radiate into her left jaw and herteeth were hurting. She reports that she is unsure exactly what time the pain started but thinks it was around 9 PM. The patient did not take anything for pain. She would have taken nitroglycerin but reports that she could not find it. She has had chest pain before she reports in October where she stated was evaluated at that point the pain radiated into her right jaw. She reports that she was told it was due to muscle pain. The patient has had some sweats with no shortness of breath, dizziness, nausea, vomiting. She reports that she did have some sharp pains across her abdomen this afternoon but that went away. The patient has recently completed treatment for an ulcer. She reports her pain currently is a 3 out of 10 in intensity and sharp. The patient is here for evaluation.   Past Medical History  Diagnosis Date  . Coronary artery disease   . Diabetes mellitus without complication (HCC)   . Hypertension   . Anxiety   . Hypercholesteremia   . Cellulitis     Patient Active Problem List   Diagnosis Date Noted  . Angina pectoris (HCC)   . Diabetes type 2, uncontrolled (HCC)   . Anxiety disorder   . Hyperlipidemia   . Pain in the chest   . Chest pain 08/30/2015    Past Surgical History  Procedure Laterality Date  . Abdominal hysterectomy    . Appendectomy    . Cholecystectomy      Current Outpatient Rx  Name  Route  Sig  Dispense  Refill  . ALPRAZolam (XANAX) 0.5  MG tablet   Oral   Take 0.5 mg by mouth 3 (three) times daily as needed for anxiety.          Marland Kitchen. amoxicillin (AMOXIL) 500 MG tablet   Oral   Take 500 mg by mouth 2 (two) times daily.         Marland Kitchen. atenolol-chlorthalidone (TENORETIC) 50-25 MG per tablet   Oral   Take 1 tablet by mouth daily.         Marland Kitchen. atorvastatin (LIPITOR) 40 MG tablet   Oral   Take 40 mg by mouth daily.         . cephALEXin (KEFLEX) 500 MG capsule   Oral   Take 1 capsule (500 mg total) by mouth 3 (three) times daily.   30 capsule   0   . clarithromycin (BIAXIN) 500 MG tablet   Oral   Take 500 mg by mouth 2 (two) times daily.         . meclizine (ANTIVERT) 25 MG tablet   Oral   Take 1 tablet (25 mg total) by mouth 3 (three) times daily as needed for dizziness.   30 tablet   0   . metFORMIN (GLUCOPHAGE) 500 MG tablet   Oral   Take 500 mg by mouth 2 (two) times daily with a meal.         .  naproxen sodium (ANAPROX) 220 MG tablet   Oral   Take 660 mg by mouth 2 (two) times daily as needed (for headache).         . nitroGLYCERIN (NITROSTAT) 0.4 MG SL tablet   Sublingual   Place 1 tablet (0.4 mg total) under the tongue every 5 (five) minutes as needed for chest pain.   30 tablet   0   . omeprazole (PRILOSEC) 20 MG capsule   Oral   Take 20 mg by mouth 2 (two) times daily before a meal.         . ondansetron (ZOFRAN ODT) 8 MG disintegrating tablet   Oral   Take 1 tablet (8 mg total) by mouth every 8 (eight) hours as needed for nausea or vomiting.   20 tablet   0   . polyethylene glycol (MIRALAX / GLYCOLAX) packet   Oral   Take 17 g by mouth daily as needed for mild constipation.         . sulfamethoxazole-trimethoprim (BACTRIM DS,SEPTRA DS) 800-160 MG tablet   Oral   Take 1 tablet by mouth 2 (two) times daily.           Allergies Aspirin and Shellfish allergy  Family History  Problem Relation Age of Onset  . Cancer Mother   . Cancer Father     Social History Social  History  Substance Use Topics  . Smoking status: Former Games developer  . Smokeless tobacco: Never Used  . Alcohol Use: No    Review of Systems Constitutional: No fever/chills Eyes: No visual changes. ENT: No sore throat. Cardiovascular: chest pain. Respiratory:  shortness of breath. Gastrointestinal:  abdominal pain.  No nausea, no vomiting.  No diarrhea.  No constipation. Genitourinary: Negative for dysuria. Musculoskeletal: Negative for back pain. Skin: Negative for rash. Neurological: Negative for headaches, focal weakness or numbness.  10-point ROS otherwise negative.  ____________________________________________   PHYSICAL EXAM:  VITAL SIGNS: ED Triage Vitals  Enc Vitals Group     BP 03/04/16 2325 160/86 mmHg     Pulse Rate 03/04/16 2325 59     Resp 03/04/16 2325 18     Temp 03/04/16 2325 97.7 F (36.5 C)     Temp Source 03/04/16 2325 Oral     SpO2 03/04/16 2325 97 %     Weight 03/04/16 2325 200 lb (90.719 kg)     Height 03/04/16 2325  (1.6 m)     Head Cir --      Peak Flow --      Pain Score 03/04/16 2328 3     Pain Loc --      Pain Edu? --      Excl. in GC? --     Constitutional: Alert and oriented. Well appearing and in Mild distress. Eyes: Conjunctivae are normal. PERRL. EOMI. Head: Atraumatic. Nose: No congestion/rhinnorhea. Mouth/Throat: Mucous membranes are moist.  Oropharynx non-erythematous. Cardiovascular: Normal rate, regular rhythm. Grossly normal heart sounds.  Good peripheral circulation. Respiratory: Normal respiratory effort.  No retractions. Lungs CTAB. Gastrointestinal: Soft and nontender. No distention. Positive bowel sounds Musculoskeletal: No lower extremity tenderness nor edema.   Neurologic:  Normal speech and language.  Skin:  Skin is warm, dry and intact.  Psychiatric: Mood and affect are normal.   ____________________________________________   LABS (all labs ordered are listed, but only abnormal results are displayed)  Labs  Reviewed  BASIC METABOLIC PANEL - Abnormal; Notable for the following:    Potassium 3.2 (*)  Glucose, Bld 103 (*)    Anion gap 3 (*)    All other components within normal limits  CBC  TROPONIN I  TROPONIN I   ____________________________________________  EKG  ED ECG REPORT I, Rebecka Apley, the attending physician, personally viewed and interpreted this ECG.   Date: 03/04/2016  EKG Time: 2332  Rate: 52  Rhythm: normal sinus rhythm  Axis: Normal  Intervals:none  ST&T Change: Flipped T waves in leads 2 and aVF T wave flattening in V3, V4, V5, V6 same as 9/16  ____________________________________________  RADIOLOGY  Chest x-ray: Borderline cardiomegaly, no acute pulmonary process. ____________________________________________   PROCEDURES  Procedure(s) performed: None  Critical Care performed: No  ____________________________________________   INITIAL IMPRESSION / ASSESSMENT AND PLAN / ED COURSE  Pertinent labs & imaging results that were available during my care of the patient were reviewed by me and considered in my medical decision making (see chart for details).  This is a 60 year old female who comes into the hospital today with chest pain. The patient reports that the pain is improved but she wanted to get it checked out. The patient's initial heart enzymes are unremarkable. I will give the patient some GI cocktail as well as a dose of nitroglycerin and reassess the patient's troponin. I will then reassess the patient.  The patient's repeat troponin is unremarkable and her chest pain is improved. She'll be discharged home to follow-up with her cardiologist. ____________________________________________   FINAL CLINICAL IMPRESSION(S) / ED DIAGNOSES  Final diagnoses:  Chest pain, unspecified chest pain type      Rebecka Apley, MD 03/05/16 [phone]

## 2016-03-05 NOTE — ED Notes (Signed)
MD at bedside. 

## 2016-03-05 NOTE — Discharge Instructions (Signed)
Nonspecific Chest Pain  °Chest pain can be caused by many different conditions. There is always a chance that your pain could be related to something serious, such as a heart attack or a blood clot in your lungs. Chest pain can also be caused by conditions that are not life-threatening. If you have chest pain, it is very important to follow up with your health care provider. °CAUSES  °Chest pain can be caused by: °· Heartburn. °· Pneumonia or bronchitis. °· Anxiety or stress. °· Inflammation around your heart (pericarditis) or lung (pleuritis or pleurisy). °· A blood clot in your lung. °· A collapsed lung (pneumothorax). It can develop suddenly on its own (spontaneous pneumothorax) or from trauma to the chest. °· Shingles infection (varicella-zoster virus). °· Heart attack. °· Damage to the bones, muscles, and cartilage that make up your chest wall. This can include: °¨ Bruised bones due to injury. °¨ Strained muscles or cartilage due to frequent or repeated coughing or overwork. °¨ Fracture to one or more ribs. °¨ Sore cartilage due to inflammation (costochondritis). °RISK FACTORS  °Risk factors for chest pain may include: °· Activities that increase your risk for trauma or injury to your chest. °· Respiratory infections or conditions that cause frequent coughing. °· Medical conditions or overeating that can cause heartburn. °· Heart disease or family history of heart disease. °· Conditions or health behaviors that increase your risk of developing a blood clot. °· Having had chicken pox (varicella zoster). °SIGNS AND SYMPTOMS °Chest pain can feel like: °· Burning or tingling on the surface of your chest or deep in your chest. °· Crushing, pressure, aching, or squeezing pain. °· Dull or sharp pain that is worse when you move, cough, or take a deep breath. °· Pain that is also felt in your back, neck, shoulder, or arm, or pain that spreads to any of these areas. °Your chest pain may come and go, or it may stay  constant. °DIAGNOSIS °Lab tests or other studies may be needed to find the cause of your pain. Your health care provider may have you take a test called an ambulatory ECG (electrocardiogram). An ECG records your heartbeat patterns at the time the test is performed. You may also have other tests, such as: °· Transthoracic echocardiogram (TTE). During echocardiography, sound waves are used to create a picture of all of the heart structures and to look at how blood flows through your heart. °· Transesophageal echocardiogram (TEE). This is a more advanced imaging test that obtains images from inside your body. It allows your health care provider to see your heart in finer detail. °· Cardiac monitoring. This allows your health care provider to monitor your heart rate and rhythm in real time. °· Holter monitor. This is a portable device that records your heartbeat and can help to diagnose abnormal heartbeats. It allows your health care provider to track your heart activity for several days, if needed. °· Stress tests. These can be done through exercise or by taking medicine that makes your heart beat more quickly. °· Blood tests. °· Imaging tests. °TREATMENT  °Your treatment depends on what is causing your chest pain. Treatment may include: °· Medicines. These may include: °¨ Acid blockers for heartburn. °¨ Anti-inflammatory medicine. °¨ Pain medicine for inflammatory conditions. °¨ Antibiotic medicine, if an infection is present. °¨ Medicines to dissolve blood clots. °¨ Medicines to treat coronary artery disease. °· Supportive care for conditions that do not require medicines. This may include: °¨ Resting. °¨ Applying heat   or cold packs to injured areas. °¨ Limiting activities until pain decreases. °HOME CARE INSTRUCTIONS °· If you were prescribed an antibiotic medicine, finish it all even if you start to feel better. °· Avoid any activities that bring on chest pain. °· Do not use any tobacco products, including  cigarettes, chewing tobacco, or electronic cigarettes. If you need help quitting, ask your health care provider. °· Do not drink alcohol. °· Take medicines only as directed by your health care provider. °· Keep all follow-up visits as directed by your health care provider. This is important. This includes any further testing if your chest pain does not go away. °· If heartburn is the cause for your chest pain, you may be told to keep your head raised (elevated) while sleeping. This reduces the chance that acid will go from your stomach into your esophagus. °· Make lifestyle changes as directed by your health care provider. These may include: °¨ Getting regular exercise. Ask your health care provider to suggest some activities that are safe for you. °¨ Eating a heart-healthy diet. A registered dietitian can help you to learn healthy eating options. °¨ Maintaining a healthy weight. °¨ Managing diabetes, if necessary. °¨ Reducing stress. °SEEK MEDICAL CARE IF: °· Your chest pain does not go away after treatment. °· You have a rash with blisters on your chest. °· You have a fever. °SEEK IMMEDIATE MEDICAL CARE IF:  °· Your chest pain is worse. °· You have an increasing cough, or you cough up blood. °· You have severe abdominal pain. °· You have severe weakness. °· You faint. °· You have chills. °· You have sudden, unexplained chest discomfort. °· You have sudden, unexplained discomfort in your arms, back, neck, or jaw. °· You have shortness of breath at any time. °· You suddenly start to sweat, or your skin gets clammy. °· You feel nauseous or you vomit. °· You suddenly feel light-headed or dizzy. °· Your heart begins to beat quickly, or it feels like it is skipping beats. °These symptoms may represent a serious problem that is an emergency. Do not wait to see if the symptoms will go away. Get medical help right away. Call your local emergency services (911 in the [place] Do not drive yourself to the hospital. °  °This  information is not intended to replace advice given to you by your health care provider. Make sure you discuss any questions you have with your health care provider. °  °Document Released: 08/30/2005 Document Revised: 12/11/2014 Document Reviewed: 06/26/2014 °Elsevier Interactive Patient Education ©2016 [employer] ° °

## 2016-08-23 ENCOUNTER — Other Ambulatory Visit: Payer: Self-pay | Admitting: Anesthesiology

## 2016-08-23 DIAGNOSIS — Z1231 Encounter for screening mammogram for malignant neoplasm of breast: Secondary | ICD-10-CM

## 2016-09-14 ENCOUNTER — Ambulatory Visit
Admission: EM | Admit: 2016-09-14 | Discharge: 2016-09-14 | Discharge status: Absent without leave | Payer: BC Managed Care – PPO

## 2016-09-15 ENCOUNTER — Encounter: Payer: Self-pay | Admitting: Gynecology

## 2016-09-15 ENCOUNTER — Ambulatory Visit
Admission: EM | Admit: 2016-09-15 | Discharge: 2016-09-15 | Disposition: A | Discharge status: Home / Self Care | Payer: BC Managed Care – PPO | Attending: Family Medicine | Admitting: Family Medicine

## 2016-09-15 DIAGNOSIS — J0100 Acute maxillary sinusitis, unspecified: Secondary | ICD-10-CM

## 2016-09-15 MED ORDER — AMOXICILLIN-POT CLAVULANATE 400-57 MG/5ML PO SUSR: ORAL | 0 refills | Status: DC

## 2016-09-15 NOTE — ED Triage Notes (Signed)
Per patient sinus pressure/ head congestion x 3 days.

## 2016-09-15 NOTE — ED Provider Notes (Signed)
[facility]    CSN: [id number] Arrival date & time: 09/15/16  1638     History   Chief Complaint Chief Complaint  Patient presents with  . Facial Pain    HPI Vanessa Hampton is a 60 y.o. female.   The history is provided by the patient.  URI  Presenting symptoms: congestion, facial pain and fatigue   Severity:  Moderate Onset quality:  Sudden Duration:  3 weeks Timing:  Constant Progression:  Partially resolved Chronicity:  New Relieved by:  Prescription medications (treated with augmentin and improved but didn't resolve) Associated symptoms: headaches and sinus pain   Associated symptoms: no arthralgias   Risk factors: not elderly, no chronic cardiac disease, no chronic kidney disease, no chronic respiratory disease, no diabetes mellitus, no immunosuppression, no recent illness and no recent travel     Past Medical History:  Diagnosis Date  . Anxiety   . Cellulitis   . Coronary artery disease   . Diabetes mellitus without complication (HCC)   . Hypercholesteremia   . Hypertension     Patient Active Problem List   Diagnosis Date Noted  . Angina pectoris (HCC)   . Diabetes type 2, uncontrolled (HCC)   . Anxiety disorder   . Hyperlipidemia   . Pain in the chest   . Chest pain 08/30/2015    Past Surgical History:  Procedure Laterality Date  . ABDOMINAL HYSTERECTOMY    . APPENDECTOMY    . CHOLECYSTECTOMY      OB History    No data available       Home Medications    Prior to Admission medications   Medication Sig Start Date End Date Taking? Authorizing Provider  ALPRAZolam Prudy Feeler) 0.5 MG tablet Take 0.5 mg by mouth 3 (three) times daily as needed for anxiety.    Yes Historical Provider, MD  atenolol-chlorthalidone (TENORETIC) 50-25 MG per tablet Take 1 tablet by mouth daily.   Yes Historical Provider, MD  cephALEXin (KEFLEX) 500 MG capsule Take 1 capsule (500 mg total) by mouth 3 (three) times daily. 11/19/15  Yes Payton Mccallum, MD    clarithromycin (BIAXIN) 500 MG tablet Take 500 mg by mouth 2 (two) times daily.   Yes Historical Provider, MD  metFORMIN (GLUCOPHAGE) 500 MG tablet Take 500 mg by mouth 2 (two) times daily with a meal.   Yes Historical Provider, MD  omeprazole (PRILOSEC) 20 MG capsule Take 20 mg by mouth 2 (two) times daily before a meal.   Yes Historical Provider, MD  sertraline (ZOLOFT) 25 MG tablet Take 25 mg by mouth daily.   Yes Historical Provider, MD  amoxicillin (AMOXIL) 500 MG tablet Take 500 mg by mouth 2 (two) times daily.    Historical Provider, MD  amoxicillin-clavulanate (AUGMENTIN) 400-57 MG/5ML suspension 10.9 ml  (875mg ) po bid x 10 days 09/15/16   Payton Mccallum, MD  atorvastatin (LIPITOR) 40 MG tablet Take 40 mg by mouth daily.    Historical Provider, MD  meclizine (ANTIVERT) 25 MG tablet Take 1 tablet (25 mg total) by mouth 3 (three) times daily as needed for dizziness. 10/17/15   Hassan Rowan, MD  naproxen sodium (ANAPROX) 220 MG tablet Take 660 mg by mouth 2 (two) times daily as needed (for headache).    Historical Provider, MD  nitroGLYCERIN (NITROSTAT) 0.4 MG SL tablet Place 1 tablet (0.4 mg total) under the tongue every 5 (five) minutes as needed for chest pain. 08/31/15   Enid Baas, MD  ondansetron (ZOFRAN ODT)  8 MG disintegrating tablet Take 1 tablet (8 mg total) by mouth every 8 (eight) hours as needed for nausea or vomiting. 10/17/15   Hassan RowanEugene Wade, MD  polyethylene glycol [facility](MIRALAX / Ethelene HalGLYCOLAX) packet Take 17 g by mouth daily as needed for mild constipation.    Historical Provider, MD  sulfamethoxazole-trimethoprim (BACTRIM DS,SEPTRA DS) 800-160 MG tablet Take 1 tablet by mouth 2 (two) times daily.    Historical Provider, MD    Family History Family History  Problem Relation Age of Onset  . Cancer Mother   . Cancer Father     Social History Social History  Substance Use Topics  . Smoking status: Former Games developermoker  . Smokeless tobacco: Never Used  . Alcohol use No      Allergies   Aspirin and Shellfish allergy   Review of Systems Review of Systems  Constitutional: Positive for fatigue.  HENT: Positive for congestion.   Musculoskeletal: Negative for arthralgias.  Neurological: Positive for headaches.     Physical Exam Triage Vital Signs ED Triage Vitals  Enc Vitals Group     BP 09/15/16 1814 (!) 157/85     Pulse Rate 09/15/16 1814 60     Resp 09/15/16 1814 16     Temp 09/15/16 1814 97.9 F (36.6 C)     Temp Source 09/15/16 1814 Oral     SpO2 09/15/16 1814 100 %     Weight 09/15/16 1817 208 lb (94.3 kg)     Height 09/15/16 1817 5\' 3"  (1.6 m)     Head Circumference --      Peak Flow --      Pain Score 09/15/16 1823 5     Pain Loc --      Pain Edu? --      Excl. in GC? --    No data found.   Updated Vital Signs BP (!) 157/85 (BP Location: Left Arm)   Pulse 60   Temp 97.9 F (36.6 C) (Oral)   Resp 16   Ht 5\' 3"  (1.6 m)   Wt 208 lb (94.3 kg)   SpO2 100%   BMI 36.85 kg/m   Visual Acuity Right Eye Distance:   Left Eye Distance:   Bilateral Distance:    Right Eye Near:   Left Eye Near:    Bilateral Near:     Physical Exam  Constitutional: She appears well-developed and well-nourished. No distress.  HENT:  Head: Normocephalic and atraumatic.  Right Ear: Tympanic membrane, external ear and ear canal normal.  Left Ear: Tympanic membrane, external ear and ear canal normal.  Nose: Mucosal edema and rhinorrhea present. No nose lacerations, sinus tenderness, nasal deformity, septal deviation or nasal septal hematoma. No epistaxis.  No foreign bodies. Right sinus exhibits maxillary sinus tenderness and frontal sinus tenderness. Left sinus exhibits maxillary sinus tenderness and frontal sinus tenderness.  Mouth/Throat: Uvula is midline, oropharynx is clear and moist and mucous membranes are normal. No oropharyngeal exudate.  Eyes: Conjunctivae and EOM are normal. Pupils are equal, round, and reactive to light. Right eye  exhibits no discharge. Left eye exhibits no discharge. No scleral icterus.  Neck: Normal range of motion. Neck supple. No thyromegaly present.  Cardiovascular: Normal rate, regular rhythm and normal heart sounds.   Pulmonary/Chest: Effort normal and breath sounds normal. No respiratory distress. She has no wheezes. She has no rales.  Lymphadenopathy:    She has no cervical adenopathy.  Skin: She is not diaphoretic.  Nursing note and vitals reviewed.  UC Treatments / Results  Labs (all labs ordered are listed, but only abnormal results are displayed) Labs Reviewed - No data to display  EKG  EKG Interpretation None       Radiology No results found.  Procedures Procedures (including critical care time)  Medications Ordered in UC Medications - No data to display   Initial Impression / Assessment and Plan / UC Course  I have reviewed the triage vital signs and the nursing notes.  Pertinent labs & imaging results that were available during my care of the patient were reviewed by me and considered in my medical decision making (see chart for details).  Clinical Course      Final Clinical Impressions(s) / UC Diagnoses   Final diagnoses:  Acute maxillary sinusitis, recurrence not specified    New Prescriptions Discharge Medication List as of 09/15/2016  6:49 PM    START taking these medications   Details  amoxicillin-clavulanate (AUGMENTIN) 400-57 MG/5ML suspension 10.9 ml  (875mg ) po bid x 10 days, Normal       1. diagnosis reviewed with patient 2. rx as per orders above; reviewed possible side effects, interactions, risks and benefits  3. Recommend supportive treatment with otc flonase 4. Follow-up prn if symptoms worsen or don't improve   Payton Mccallum, MD 09/15/16 1924

## 2016-09-15 NOTE — Discharge Instructions (Signed)
Flonase nasal spray twice daily

## 2016-09-20 ENCOUNTER — Ambulatory Visit
Admission: RE | Admit: 2016-09-20 | Discharge: 2016-09-20 | Disposition: A | Discharge status: Home / Self Care | Payer: BC Managed Care – PPO | Source: Ambulatory Visit | Attending: Anesthesiology | Admitting: Anesthesiology

## 2016-09-20 DIAGNOSIS — Z1231 Encounter for screening mammogram for malignant neoplasm of breast: Secondary | ICD-10-CM | POA: Insufficient documentation

## 2016-12-17 ENCOUNTER — Ambulatory Visit
Admission: EM | Admit: 2016-12-17 | Discharge: 2016-12-17 | Disposition: A | Discharge status: Home / Self Care | Payer: BC Managed Care – PPO | Attending: Emergency Medicine | Admitting: Emergency Medicine

## 2016-12-17 ENCOUNTER — Encounter: Payer: Self-pay | Admitting: Gynecology

## 2016-12-17 DIAGNOSIS — J069 Acute upper respiratory infection, unspecified: Secondary | ICD-10-CM | POA: Diagnosis not present

## 2016-12-17 DIAGNOSIS — B9789 Other viral agents as the cause of diseases classified elsewhere: Secondary | ICD-10-CM | POA: Diagnosis not present

## 2016-12-17 MED ORDER — PREDNISONE 20 MG PO TABS: ORAL_TABLET | ORAL | 0 refills | Status: DC

## 2016-12-17 MED ORDER — FLUTICASONE PROPIONATE 50 MCG/ACT NA SUSP: 2.0000 | Freq: Every day | NASAL | 0 refills | Status: DC

## 2016-12-17 MED ORDER — BENZONATATE 100 MG PO CAPS: ORAL_CAPSULE | ORAL | 0 refills | Status: DC

## 2016-12-17 MED ORDER — HYDROCOD POLST-CPM POLST ER 10-8 MG/5ML PO SUER: 5.0000 mL | Freq: Two times a day (BID) | ORAL | 0 refills | Status: DC

## 2016-12-17 NOTE — ED Provider Notes (Signed)
CSN: [id number]     Arrival date & time 12/17/16  1027 History   First MD Initiated Contact with Patient 12/17/16 1307     Chief Complaint  Patient presents with  . Cough   (Consider location/radiation/quality/duration/timing/severity/associated sxs/prior Treatment) HPI  Is a 61 year old female who presents with a 3 day history of constant coughing of white sputum and postnasal drip. A fourth grade school teacher. She states that the coughing has kept her awake for the last 3 nights. She does not seem to be able to control the coughing and coughs incessantly throughout the interview. She's had no fever O2 sats on room air is 100% respirations 16 pressure is 173/96.     Past Medical History:  Diagnosis Date  . Anxiety   . Cellulitis   . Coronary artery disease   . Diabetes mellitus without complication (HCC)   . Hypercholesteremia   . Hypertension    Past Surgical History:  Procedure Laterality Date  . ABDOMINAL HYSTERECTOMY    . APPENDECTOMY    . CHOLECYSTECTOMY     Family History  Problem Relation Age of Onset  . Cancer Mother   . Cancer Father    Social History  Substance Use Topics  . Smoking status: Former Games developer  . Smokeless tobacco: Never Used  . Alcohol use No   OB History    No data available     Review of Systems  Constitutional: Positive for activity change. Negative for chills, fatigue and fever.  HENT: Positive for congestion, postnasal drip, rhinorrhea, sinus pain, sinus pressure and sore throat.   Respiratory: Positive for cough. Negative for shortness of breath, wheezing and stridor.   All other systems reviewed and are negative.   Allergies  Aspirin and Shellfish allergy  Home Medications   Prior to Admission medications   Medication Sig Start Date End Date Taking? Authorizing Provider  ALPRAZolam Prudy Feeler) 0.5 MG tablet Take 0.5 mg by mouth 3 (three) times daily as needed for anxiety.    Yes Historical Provider, MD  atenolol-chlorthalidone  (TENORETIC) 50-25 MG per tablet Take 1 tablet by mouth daily.   Yes Historical Provider, MD  atorvastatin (LIPITOR) 40 MG tablet Take 40 mg by mouth daily.   Yes Historical Provider, MD  lisinopril (PRINIVIL,ZESTRIL) 2.5 MG tablet Take 2.5 mg by mouth daily.   Yes Historical Provider, MD  meclizine (ANTIVERT) 25 MG tablet Take 1 tablet (25 mg total) by mouth 3 (three) times daily as needed for dizziness. 10/17/15  Yes Hassan Rowan, MD  metFORMIN (GLUCOPHAGE) 500 MG tablet Take 500 mg by mouth 2 (two) times daily with a meal.   Yes Historical Provider, MD  naproxen (NAPROSYN) 500 MG tablet Take 500 mg by mouth 2 (two) times daily with a meal.   Yes Historical Provider, MD  naproxen sodium (ANAPROX) 220 MG tablet Take 660 mg by mouth 2 (two) times daily as needed (for headache).   Yes Historical Provider, MD  nitroGLYCERIN (NITROSTAT) 0.4 MG SL tablet Place 1 tablet (0.4 mg total) under the tongue every 5 (five) minutes as needed for chest pain. 08/31/15  Yes Enid Baas, MD  omeprazole (PRILOSEC) 20 MG capsule Take 20 mg by mouth 2 (two) times daily before a meal.   Yes Historical Provider, MD  ondansetron (ZOFRAN ODT) 8 MG disintegrating tablet Take 1 tablet (8 mg total) by mouth every 8 (eight) hours as needed for nausea or vomiting. 10/17/15  Yes Hassan Rowan, MD  polyethylene glycol [facility] / Ethelene Hal) packet  Take 17 g by mouth daily as needed for mild constipation.   Yes Historical Provider, MD  sertraline (ZOLOFT) 25 MG tablet Take 25 mg by mouth daily.   Yes Historical Provider, MD  amoxicillin (AMOXIL) 500 MG tablet Take 500 mg by mouth 2 (two) times daily.    Historical Provider, MD  amoxicillin-clavulanate (AUGMENTIN) 400-57 MG/5ML suspension 10.9 ml  (875mg ) po bid x 10 days 09/15/16   Payton Mccallumrlando Conty, MD  benzonatate (TESSALON) 100 MG capsule Take 1 pill 3 times a day PRN for cough 12/17/16   Lutricia FeilWilliam P Roemer, PA-C  cephALEXin (KEFLEX) 500 MG capsule Take 1 capsule (500 mg total) by mouth  3 (three) times daily. 11/19/15   Payton Mccallumrlando Conty, MD  chlorpheniramine-HYDROcodone (TUSSIONEX PENNKINETIC ER) 10-8 MG/5ML SUER Take 5 mLs by mouth 2 (two) times daily. 12/17/16   Lutricia FeilWilliam P Roemer, PA-C  clarithromycin (BIAXIN) 500 MG tablet Take 500 mg by mouth 2 (two) times daily.    Historical Provider, MD  fluticasone (FLONASE) 50 MCG/ACT nasal spray Place 2 sprays into both nostrils daily. 12/17/16   Lutricia FeilWilliam P Roemer, PA-C  predniSONE (DELTASONE) 20 MG tablet Take 2 tablets (40 mg) daily by mouth 12/17/16   Lutricia FeilWilliam P Roemer, PA-C  sulfamethoxazole-trimethoprim (BACTRIM DS,SEPTRA DS) 800-160 MG tablet Take 1 tablet by mouth 2 (two) times daily.    Historical Provider, MD   Meds Ordered and Administered this Visit  Medications - No data to display  BP (!) 154/82 (BP Location: Right Arm)   Pulse 62   Temp 97.8 F (36.6 C) (Oral)   Resp 16   Ht 5\' 3"  (1.6 m)   Wt 215 lb (97.5 kg)   SpO2 100%   BMI 38.09 kg/m  No data found.   Physical Exam  Constitutional: She is oriented to person, place, and time. She appears well-developed and well-nourished. No distress.  HENT:  Head: Normocephalic and atraumatic.  Right Ear: External ear normal.  Left Ear: External ear normal.  Nose: Nose normal.  Mouth/Throat: Oropharynx is clear and moist. No oropharyngeal exudate.  Eyes: EOM are normal. Pupils are equal, round, and reactive to light. Right eye exhibits no discharge. Left eye exhibits no discharge.  Neck: Normal range of motion. Neck supple.  Pulmonary/Chest: Effort normal and breath sounds normal. No respiratory distress. She has no wheezes. She has no rales.  Musculoskeletal: Normal range of motion.  Lymphadenopathy:    She has no cervical adenopathy.  Neurological: She is alert and oriented to person, place, and time.  Skin: Skin is warm and dry. She is not diaphoretic.  Psychiatric: She has a normal mood and affect. Her behavior is normal. Judgment and thought content normal.  Nursing  note and vitals reviewed.   Urgent Care Course   Clinical Course     Procedures (including critical care time)  Labs Review Labs Reviewed - No data to display  Imaging Review No results found.   Visual Acuity Review  Right Eye Distance:   Left Eye Distance:   Bilateral Distance:    Right Eye Near:   Left Eye Near:    Bilateral Near:     Blood pressure at discharge was 154/82    MDM   1. Viral URI with cough    Discharge Medication List as of 12/17/2016  1:23 PM    START taking these medications   Details  benzonatate (TESSALON) 100 MG capsule Take 1 pill 3 times a day PRN for cough, Normal  chlorpheniramine-HYDROcodone (TUSSIONEX PENNKINETIC ER) 10-8 MG/5ML SUER Take 5 mLs by mouth 2 (two) times daily., Starting Sun 12/17/2016, Print    fluticasone (FLONASE) 50 MCG/ACT nasal spray Place 2 sprays into both nostrils daily., Starting Sun 12/17/2016, Normal    predniSONE (DELTASONE) 20 MG tablet Take 2 tablets (40 mg) daily by mouth, Normal      Plan: 1. Test/x-ray results and diagnosis reviewed with patient 2. rx as per orders; risks, benefits, potential side effects reviewed with patient 3. Recommend supportive treatment with Fluids and rest. Cautioned patient regarding use of Tussionex with activities that require concentration and judgment. She should not drive while taking Tussionex. 4. F/u prn if symptoms worsen or don't improve     Lutricia Feil, PA-C 12/17/16 1333

## 2016-12-17 NOTE — ED Triage Notes (Signed)
Per patient c/o  Cough/ nasal drip x 3 days.

## 2016-12-30 ENCOUNTER — Ambulatory Visit
Admission: EM | Admit: 2016-12-30 | Discharge: 2016-12-30 | Disposition: A | Discharge status: Home / Self Care | Payer: BC Managed Care – PPO | Attending: Family Medicine | Admitting: Family Medicine

## 2016-12-30 ENCOUNTER — Encounter: Payer: Self-pay | Admitting: Gynecology

## 2016-12-30 DIAGNOSIS — J40 Bronchitis, not specified as acute or chronic: Secondary | ICD-10-CM | POA: Diagnosis not present

## 2016-12-30 DIAGNOSIS — J0100 Acute maxillary sinusitis, unspecified: Secondary | ICD-10-CM

## 2016-12-30 MED ORDER — BENZONATATE 100 MG PO CAPS: 100.0000 mg | ORAL_CAPSULE | Freq: Three times a day (TID) | ORAL | 0 refills | Status: DC | PRN

## 2016-12-30 MED ORDER — DOXYCYCLINE HYCLATE 100 MG PO CAPS: 100.0000 mg | ORAL_CAPSULE | Freq: Two times a day (BID) | ORAL | 0 refills | Status: DC

## 2016-12-30 MED ORDER — HYDROCOD POLST-CPM POLST ER 10-8 MG/5ML PO SUER: 5.0000 mL | Freq: Every evening | ORAL | 0 refills | Status: DC | PRN

## 2016-12-30 MED ORDER — ALBUTEROL SULFATE HFA 108 (90 BASE) MCG/ACT IN AERS: 2.0000 | INHALATION_SPRAY | Freq: Four times a day (QID) | RESPIRATORY_TRACT | 0 refills | Status: AC | PRN

## 2016-12-30 NOTE — ED Provider Notes (Signed)
[facility] ____________________________________________  Time seen: Approximately 9:17 AM  I have reviewed the triage vital signs and the nursing notes.   HISTORY  Chief Complaint Cough   HPI Vanessa Hampton is a 61 y.o. female  present for the complaints of 2.5 weeks of cough. Patient reports she is still having nasal congestion and some sinus pressure. Patient reports that she was seen in urgent care 2 weeks ago for same complaint at that time told she had a viral respiratory infection. Patient reports that she was given prednisone and cough medications which she states the cough medicines do help but she has now ran out. Patient states she did not feel any difference with the prednisone. Patient denies any known fevers. States the cough is primarily a dry hacking cough. Also reports some sinus tenderness around her cheeks and frequently blowing her nose with thick mucus. Patient reports her biggest complaint is the cough as she is a Runner, broadcasting/film/videoteacher, and reports she recently coughs while she is at work.  Reports cough does also intermittently wake her up at night. States occasional wheezing heard when she is coughing at night. Patient states no chest pain or shortness of breath, but states she does have a hard time catching her breath when she is actively coughing back-to-back. Denies chest pain with deep breath. Denies any other recent sickness or recent antibiotic use.  Denies chest pain, chest pain with deep breath, shortness of breath, hemoptysis, abdominal pain, dysuria, extremity pain, extremity swelling or rash. Denies recent antibiotic use. Reports has continued to eat and drink well. Denies sore throat. Denies long trips. Denies renal insufficiency.  Rich NumberLINDLEY, KRYSTA, PA-C: PCP   Past Medical History:  Diagnosis Date  . Anxiety   . Cellulitis   . Coronary artery disease   . Diabetes mellitus without complication (HCC)   . Hypercholesteremia   . Hypertension      Patient Active Problem List   Diagnosis Date Noted  . Angina pectoris (HCC)   . Diabetes type 2, uncontrolled (HCC)   . Anxiety disorder   . Hyperlipidemia   . Pain in the chest   . Chest pain 08/30/2015    Past Surgical History:  Procedure Laterality Date  . ABDOMINAL HYSTERECTOMY    . APPENDECTOMY    . CHOLECYSTECTOMY       No current facility-administered medications for this encounter.   Current Outpatient Prescriptions:  .  ALPRAZolam (XANAX) 0.5 MG tablet, Take 0.5 mg by mouth 3 (three) times daily as needed for anxiety. , Disp: , Rfl:  .  amoxicillin (AMOXIL) 500 MG tablet, Take 500 mg by mouth 2 (two) times daily., Disp: , Rfl:  .  atenolol-chlorthalidone (TENORETIC) 50-25 MG per tablet, Take 1 tablet by mouth daily., Disp: , Rfl:  .  atorvastatin (LIPITOR) 40 MG tablet, Take 40 mg by mouth daily., Disp: , Rfl:  .  clarithromycin (BIAXIN) 500 MG tablet, Take 500 mg by mouth 2 (two) times daily., Disp: , Rfl:  .  Hydrocodone-Chlorpheniramine 5-4 MG/5ML SOLN, Take by mouth., Disp: , Rfl:  .  lisinopril (PRINIVIL,ZESTRIL) 2.5 MG tablet, Take 2.5 mg by mouth daily., Disp: , Rfl:  .  meclizine (ANTIVERT) 25 MG tablet, Take 1 tablet (25 mg total) by mouth 3 (three) times daily as needed for dizziness., Disp: 30 tablet, Rfl: 0 .  metFORMIN (GLUCOPHAGE) 500 MG tablet, Take 500 mg by mouth 2 (two) times daily with a meal., Disp: , Rfl:  .  naproxen (NAPROSYN) 500  MG tablet, Take 500 mg by mouth 2 (two) times daily with a meal., Disp: , Rfl:  .  naproxen sodium (ANAPROX) 220 MG tablet, Take 660 mg by mouth 2 (two) times daily as needed (for headache)., Disp: , Rfl:  .  nitroGLYCERIN (NITROSTAT) 0.4 MG SL tablet, Place 1 tablet (0.4 mg total) under the tongue every 5 (five) minutes as needed for chest pain., Disp: 30 tablet, Rfl: 0 .  omeprazole (PRILOSEC) 20 MG capsule, Take 20 mg by mouth 2 (two) times daily before a meal., Disp: , Rfl:  .  ondansetron (ZOFRAN ODT) 8 MG  disintegrating tablet, Take 1 tablet (8 mg total) by mouth every 8 (eight) hours as needed for nausea or vomiting., Disp: 20 tablet, Rfl: 0 .  polyethylene glycol (MIRALAX / GLYCOLAX) packet, Take 17 g by mouth daily as needed for mild constipation., Disp: , Rfl:  .  sertraline (ZOLOFT) 25 MG tablet, Take 25 mg by mouth daily., Disp: , Rfl:  .  albuterol (PROVENTIL HFA;VENTOLIN HFA) 108 (90 Base) MCG/ACT inhaler, Inhale 2 puffs into the lungs every 6 (six) hours as needed for wheezing., Disp: 1 Inhaler, Rfl: 0 .  amoxicillin-clavulanate (AUGMENTIN) 400-57 MG/5ML suspension, 10.9 ml  (875mg ) po bid x 10 days, Disp: 220 mL, Rfl: 0 .  benzonatate (TESSALON PERLES) 100 MG capsule, Take 1 capsule (100 mg total) by mouth 3 (three) times daily as needed for cough., Disp: 15 capsule, Rfl: 0 .  chlorpheniramine-HYDROcodone (TUSSIONEX PENNKINETIC ER) 10-8 MG/5ML SUER, Take 5 mLs by mouth at bedtime as needed for cough. do not drive or operate machinery while taking as can cause drowsiness., Disp: 75 mL, Rfl: 0 .  doxycycline (VIBRAMYCIN) 100 MG capsule, Take 1 capsule (100 mg total) by mouth 2 (two) times daily., Disp: 20 capsule, Rfl: 0 .  fluticasone (FLONASE) 50 MCG/ACT nasal spray, Place 2 sprays into both nostrils daily., Disp: 16 g, Rfl: 0 .  sulfamethoxazole-trimethoprim (BACTRIM DS,SEPTRA DS) 800-160 MG tablet, Take 1 tablet by mouth 2 (two) times daily., Disp: , Rfl:   Allergies Aspirin and Shellfish allergy  Family History  Problem Relation Age of Onset  . Cancer Mother   . Cancer Father     Social History Social History  Substance Use Topics  . Smoking status: Former Games developer  . Smokeless tobacco: Never Used  . Alcohol use No    Review of Systems Constitutional: No fever/chills Eyes: No visual changes. ENT: No sore throat. Cardiovascular: Denies chest pain. Respiratory: Denies shortness of breath. Gastrointestinal: No abdominal pain.  No nausea, no vomiting.  No diarrhea.  No  constipation. Genitourinary: Negative for dysuria. Musculoskeletal: Negative for back pain. Skin: Negative for rash. Neurological: Negative for headaches, focal weakness or numbness.  10-point ROS otherwise negative.  ____________________________________________   PHYSICAL EXAM:  VITAL SIGNS: ED Triage Vitals  Enc Vitals Group     BP 12/30/16 0855 110/80     Pulse Rate 12/30/16 0855 62     Resp 12/30/16 0855 16     Temp 12/30/16 0855 97.9 F (36.6 C)     Temp Source 12/30/16 0855 Oral     SpO2 12/30/16 0855 99 %     Weight 12/30/16 0857 215 lb (97.5 kg)     Height --      Head Circumference --      Peak Flow --      Pain Score 12/30/16 0855 2     Pain Loc --      Pain  Edu? --      Excl. in GC? --     Constitutional: Alert and oriented. Well appearing and in no acute distress. Eyes: Conjunctivae are normal. PERRL. EOMI. Head: Atraumatic.Mild to moderate tenderness to palpation bilateral maxillary sinuses; no frontal sinus tenderness to palpation. No swelling. No erythema.   Ears: no erythema, normal TMs bilaterally.   Nose: nasal congestion with bilateral nasal turbinate erythema and edema.   Mouth/Throat: Mucous membranes are moist.  Oropharynx non-erythematous.No tonsillar swelling or exudate.  Neck: No stridor.  No cervical spine tenderness to palpation. Hematological/Lymphatic/Immunilogical: No cervical lymphadenopathy. Cardiovascular: Normal rate, regular rhythm. Grossly normal heart sounds.  Good peripheral circulation. Respiratory: Normal respiratory effort.  No retractions. Speaks in complete sentences.  No wheezes, rales or rhonchi. Good air movement.Dry intermittent cough noted in room with mild bronchospasm wheeze.  Gastrointestinal: Soft and nontender. No CVA tenderness. Musculoskeletal: No lower or upper extremity tenderness nor edema. No cervical, thoracic or lumbar tenderness to palpation.  Neurologic:  Normal speech and language. No gait instability. Skin:   Skin is warm, dry and intact. No rash noted. Psychiatric: Mood and affect are normal. Speech and behavior are normal.  ___________________________________________   LABS (all labs ordered are listed, but only abnormal results are displayed)  Labs Reviewed - No data to display ____________________________________________   PROCEDURES Procedures    INITIAL IMPRESSION / ASSESSMENT AND PLAN / ED COURSE  Pertinent labs & imaging results that were available during my care of the patient were reviewed by me and considered in my medical decision making (see chart for details).  Well-appearing patient. No acute distress. Lungs clear throughout, except for dry intermittent cough noted in room with mild bronchospasm. No other wheezing noted. Patient denies fevers. Denies chest pain or shortness of breath. Suspect bronchitis and maxillary sinusitis. Discussed treatment options with patient. Patient declines another course of prednisone. Will treat patient with oral doxycycline, when necessary Tussionex, when necessary Tessalon Perles during the day and when necessary albuterol inhaler. Discussed with patient evaluation and chest x-ray and as lungs sound clear throughout at rest, will defer chest x-ray at this time, and patient agrees to this. Discussed strict follow-up and return parameters.Discussed indication, risks and benefits of medications with patient.  Discussed follow up with Primary care physician this week. Discussed follow up and return parameters including no resolution or any worsening concerns. Patient verbalized understanding and agreed to plan.   ____________________________________________   FINAL CLINICAL IMPRESSION(S) / ED DIAGNOSES  Final diagnoses:  Bronchitis  Acute maxillary sinusitis, recurrence not specified     Discharge Medication List as of 12/30/2016  9:21 AM    START taking these medications   Details  albuterol (PROVENTIL HFA;VENTOLIN HFA) 108 (90 Base)  MCG/ACT inhaler Inhale 2 puffs into the lungs every 6 (six) hours as needed for wheezing., Starting Sat 12/30/2016, Normal    doxycycline (VIBRAMYCIN) 100 MG capsule Take 1 capsule (100 mg total) by mouth 2 (two) times daily., Starting Sat 12/30/2016, Normal        Note: This dictation was prepared with Dragon dictation along with smaller phrase technology. Any transcriptional errors that result from this process are unintentional.         Renford Dills, NP 12/30/16 1108    Renford Dills, NP 12/30/16 [phone]

## 2016-12-30 NOTE — ED Triage Notes (Signed)
Patient c/o cough . Patient c/o seen x 2 week ago and not getting any better.

## 2016-12-30 NOTE — Discharge Instructions (Signed)
Take medication as prescribed. Rest. Drink plenty of fluids.  ° °Follow up with your primary care physician this week as needed. Return to Urgent care for new or worsening concerns.  ° °

## 2017-01-14 ENCOUNTER — Ambulatory Visit
Admission: EM | Admit: 2017-01-14 | Discharge: 2017-01-14 | Disposition: A | Discharge status: Home / Self Care | Payer: BC Managed Care – PPO | Attending: Family Medicine | Admitting: Family Medicine

## 2017-01-14 DIAGNOSIS — J0140 Acute pansinusitis, unspecified: Secondary | ICD-10-CM | POA: Diagnosis not present

## 2017-01-14 MED ORDER — AMOXICILLIN-POT CLAVULANATE 875-125 MG PO TABS
1.0000 | ORAL_TABLET | Freq: Two times a day (BID) | ORAL | 0 refills | Status: AC
Start: 2017-01-14 — End: 2017-01-21

## 2017-01-14 NOTE — ED Provider Notes (Signed)
CSN: [id number]     Arrival date & time 01/14/17  1416 History   First MD Initiated Contact with Patient 01/14/17 1652     Chief Complaint  Patient presents with  . Cough  . Facial Pain   (Consider location/radiation/quality/duration/timing/severity/associated sxs/prior Treatment) 61 year old female with continued issues with cough and sinus congestion. Started with a cough and chest congestion about a month ago. Was seen here at Urgent Care- dx with viral illness. Started on Benzonatate, Flonase, Prednisone for 5 days and cough medication with codeine. Returned after 2 weeks with continued cough but more sinus congestion. Started on Doxycycline and Albuterol. She just finished the antibiotic 2 days ago and continues to experience more sinus pain and pressure. She has been using Flonase and her Netti-pot with some relief. She is also blowing out and spitting up blood. Denies any fever, chest tightness or pain or GI symptoms. No previous history of recurrent sinus infections except that she had a sinus infection in October in which she took Augmentin with good success.    The history is provided by the patient.    Past Medical History:  Diagnosis Date  . Anxiety   . Cellulitis   . Coronary artery disease   . Diabetes mellitus without complication (HCC)   . Hypercholesteremia   . Hypertension    Past Surgical History:  Procedure Laterality Date  . ABDOMINAL HYSTERECTOMY    . APPENDECTOMY    . CHOLECYSTECTOMY     Family History  Problem Relation Age of Onset  . Cancer Mother   . Cancer Father    Social History  Substance Use Topics  . Smoking status: Former Games developer  . Smokeless tobacco: Never Used  . Alcohol use No   OB History    No data available     Review of Systems  Constitutional: Positive for fatigue. Negative for chills and fever.  HENT: Positive for congestion, nosebleeds, postnasal drip, sinus pain and sinus pressure. Negative for ear pain and sore throat.    Eyes: Negative for discharge.  Respiratory: Positive for cough. Negative for chest tightness, shortness of breath and wheezing.   Cardiovascular: Negative for chest pain.  Gastrointestinal: Negative for abdominal pain, diarrhea, nausea and vomiting.  Musculoskeletal: Negative for arthralgias, myalgias, neck pain and neck stiffness.  Skin: Negative for rash.  Neurological: Positive for headaches. Negative for dizziness, syncope, weakness, light-headedness and numbness.  Hematological: Negative for adenopathy.    Allergies  Aspirin and Shellfish allergy  Home Medications   Prior to Admission medications   Medication Sig Start Date End Date Taking? Authorizing Provider  albuterol (PROVENTIL HFA;VENTOLIN HFA) 108 (90 Base) MCG/ACT inhaler Inhale 2 puffs into the lungs every 6 (six) hours as needed for wheezing. 12/30/16   Renford Dills, NP  ALPRAZolam Prudy Feeler) 0.5 MG tablet Take 0.5 mg by mouth 3 (three) times daily as needed for anxiety.     Historical Provider, MD  amoxicillin-clavulanate (AUGMENTIN) 875-125 MG tablet Take 1 tablet by mouth every 12 (twelve) hours. 01/14/17 01/21/17  Sudie Grumbling, NP  atenolol-chlorthalidone (TENORETIC) 50-25 MG per tablet Take 1 tablet by mouth daily.    Historical Provider, MD  fluticasone (FLONASE) 50 MCG/ACT nasal spray Place 2 sprays into both nostrils daily. 12/17/16   Lutricia Feil, PA-C  lisinopril (PRINIVIL,ZESTRIL) 2.5 MG tablet Take 2.5 mg by mouth daily.    Historical Provider, MD  metFORMIN (GLUCOPHAGE) 500 MG tablet Take 500 mg by mouth 2 (two) times daily with a  meal.    Historical Provider, MD  nitroGLYCERIN (NITROSTAT) 0.4 MG SL tablet Place 1 tablet (0.4 mg total) under the tongue every 5 (five) minutes as needed for chest pain. 08/31/15   Enid Baasadhika Kalisetti, MD  omeprazole (PRILOSEC) 20 MG capsule Take 20 mg by mouth 2 (two) times daily before a meal.    Historical Provider, MD   Meds Ordered and Administered this Visit  Medications -  No data to display  BP 139/76 (BP Location: Left Arm)   Pulse 65   Temp 98.3 F (36.8 C) (Oral)   Resp 18   Ht 5\' 3"  (1.6 m)   Wt 215 lb (97.5 kg)   SpO2 98%   BMI 38.09 kg/m  No data found.   Physical Exam  Constitutional: She is oriented to person, place, and time. She appears well-developed and well-nourished. No distress.  HENT:  Head: Normocephalic and atraumatic.  Right Ear: Hearing, tympanic membrane, external ear and ear canal normal.  Left Ear: Hearing, tympanic membrane, external ear and ear canal normal.  Nose: Mucosal edema and rhinorrhea present. Right sinus exhibits maxillary sinus tenderness and frontal sinus tenderness. Left sinus exhibits maxillary sinus tenderness and frontal sinus tenderness.  Mouth/Throat: Uvula is midline, oropharynx is clear and moist and mucous membranes are normal. No posterior oropharyngeal erythema.  Neck: Normal range of motion. Neck supple.  Cardiovascular: Normal rate, regular rhythm and normal heart sounds.   Pulmonary/Chest: Effort normal. No respiratory distress. She has decreased breath sounds in the right upper field, the right lower field, the left upper field and the left lower field. She has no wheezes. She has no rhonchi.  Lymphadenopathy:    She has no cervical adenopathy.  Neurological: She is alert and oriented to person, place, and time.  Skin: Skin is warm and dry. Capillary refill takes less than 2 seconds.  Psychiatric: She has a normal mood and affect. Her behavior is normal. Judgment and thought content normal.    Urgent Care Course     Procedures (including critical care time)  Labs Review Labs Reviewed - No data to display  Imaging Review No results found.   Visual Acuity Review  Right Eye Distance:   Left Eye Distance:   Bilateral Distance:    Right Eye Near:   Left Eye Near:    Bilateral Near:         MDM   1. Acute non-recurrent pansinusitis    Discussed that she has a persistent sinus  infection and bronchitis. Will trial Augmentin 875mg  twice a day as directed since this medication has worked well for her in the past. Offered to retry Prednisone but patient declined since it was not helpful recently. Recommend take OTC Delsym as needed for cough. Continue Flonase as directed. Use Albuterol inhaler 2 puffs every 6 hours as needed for cough. Recommend referral to [facility] for further evaluation. Patient will call in the morning to schedule an appointment.     Sudie GrumblingAnn Berry Kidada Ging, NP 01/14/17 [phone]

## 2017-01-14 NOTE — Discharge Instructions (Signed)
Recommend start Augmentin twice a day as directed. Continue Flonase and Netti-pot as directed. Use Albuterol inhaler 2 puffs every 6 to 8 hours as needed for cough. Call [facility] tomorrow to schedule appointment for further evaluation.

## 2017-01-14 NOTE — ED Triage Notes (Signed)
Pt has been seen twice before for same and just finished her ABX and Oxycodone. Tried EchoStareti Pot yesterday and reports that it did help with the facial pressure. Pt continues with cough, blowing blood out of her nose and had pain over her eyes. Pain 8/10

## 2017-05-07 ENCOUNTER — Emergency Department (HOSPITAL_COMMUNITY)
Admission: EM | Admit: 2017-05-07 | Discharge: 2017-05-07 | Disposition: A | Discharge status: Home / Self Care | Payer: BC Managed Care – PPO | Attending: Emergency Medicine | Admitting: Emergency Medicine

## 2017-05-07 DIAGNOSIS — R55 Syncope and collapse: Secondary | ICD-10-CM | POA: Insufficient documentation

## 2017-05-07 DIAGNOSIS — Z5321 Procedure and treatment not carried out due to patient leaving prior to being seen by health care provider: Secondary | ICD-10-CM | POA: Diagnosis not present

## 2017-11-29 ENCOUNTER — Ambulatory Visit
Admission: EM | Admit: 2017-11-29 | Discharge: 2017-11-29 | Disposition: A | Discharge status: Home / Self Care | Payer: BC Managed Care – PPO | Attending: Family Medicine | Admitting: Family Medicine

## 2017-11-29 ENCOUNTER — Encounter: Payer: Self-pay | Admitting: *Deleted

## 2017-11-29 DIAGNOSIS — J988 Other specified respiratory disorders: Secondary | ICD-10-CM

## 2017-11-29 DIAGNOSIS — J029 Acute pharyngitis, unspecified: Secondary | ICD-10-CM | POA: Diagnosis not present

## 2017-11-29 DIAGNOSIS — R05 Cough: Secondary | ICD-10-CM

## 2017-11-29 MED ORDER — DOXYCYCLINE HYCLATE 100 MG PO CAPS: 100.0000 mg | ORAL_CAPSULE | Freq: Two times a day (BID) | ORAL | 0 refills | Status: DC

## 2017-11-29 MED ORDER — FLUTICASONE PROPIONATE 50 MCG/ACT NA SUSP: 2.0000 | Freq: Every day | NASAL | 0 refills | Status: DC

## 2017-11-29 MED ORDER — HYDROCOD POLST-CPM POLST ER 10-8 MG/5ML PO SUER: 5.0000 mL | Freq: Two times a day (BID) | ORAL | 0 refills | Status: DC | PRN

## 2017-11-29 NOTE — ED Provider Notes (Signed)
[facility]    CSN: [id number] Arrival date & time: 11/29/17  1241  History   Chief Complaint Chief Complaint  Patient presents with  . Cough  . Nasal Congestion   HPI  61 year old female presents with the above complaints.  Patient states she has been sick since Monday.  She states that her symptoms have slowly been worsening.  Of note, she is recently had a dental infection which reportedly went to her sinuses and she was placed on antibiotic.  She subsequently got sick afterwards.  She has been experiencing cough and congestion.  Cough is worse at night.  No associated fever.  She has been using home remedies as well as TheraFlu without resolution.  She is also been using Tylenol.  She reports additional symptoms of nausea and sore throat.  No other associated symptoms.  No other complaints at this time.  Past Medical History:  Diagnosis Date  . Anxiety   . Cellulitis   . Coronary artery disease   . Diabetes mellitus without complication (HCC)   . Hypercholesteremia   . Hypertension    Patient Active Problem List   Diagnosis Date Noted  . Angina pectoris (HCC)   . Diabetes type 2, uncontrolled (HCC)   . Anxiety disorder   . Hyperlipidemia   . Pain in the chest   . Chest pain 08/30/2015   Past Surgical History:  Procedure Laterality Date  . ABDOMINAL HYSTERECTOMY    . APPENDECTOMY    . CHOLECYSTECTOMY     OB History    No data available     Home Medications    Prior to Admission medications   Medication Sig Start Date End Date Taking? Authorizing Provider  atenolol-chlorthalidone (TENORETIC) 50-25 MG per tablet Take 1 tablet by mouth daily.   Yes [provider]  metFORMIN (GLUCOPHAGE) 500 MG tablet Take 500 mg by mouth 2 (two) times daily with a meal.   Yes [provider]  omeprazole (PRILOSEC) 20 MG capsule Take 20 mg by mouth 2 (two) times daily before a meal.   Yes [provider]  albuterol (PROVENTIL HFA;VENTOLIN  HFA) 108 (90 Base) MCG/ACT inhaler Inhale 2 puffs into the lungs every 6 (six) hours as needed for wheezing. 12/30/16   Renford DillsMiller, Lindsey, NP  ALPRAZolam Prudy Feeler(XANAX) 0.5 MG tablet Take 0.5 mg by mouth 3 (three) times daily as needed for anxiety.     [provider]  chlorpheniramine-HYDROcodone (TUSSIONEX PENNKINETIC ER) 10-8 MG/5ML SUER Take 5 mLs by mouth every 12 (twelve) hours as needed. 11/29/17   Tommie Samsook, Avi Kerschner G, DO  doxycycline (VIBRAMYCIN) 100 MG capsule Take 1 capsule (100 mg total) by mouth 2 (two) times daily. 11/29/17   Tommie Samsook, Maliik Karner G, DO  fluticasone (FLONASE) 50 MCG/ACT nasal spray Place 2 sprays into both nostrils daily. 11/29/17   Tommie Samsook, Kouper Spinella G, DO  lisinopril (PRINIVIL,ZESTRIL) 2.5 MG tablet Take 2.5 mg by mouth daily.    [provider]  nitroGLYCERIN (NITROSTAT) 0.4 MG SL tablet Place 1 tablet (0.4 mg total) under the tongue every 5 (five) minutes as needed for chest pain. 08/31/15   Enid BaasKalisetti, Radhika, MD   Family History Family History  Problem Relation Age of Onset  . Cancer Mother   . Cancer Father    Social History Social History   Tobacco Use  . Smoking status: Former Games developermoker  . Smokeless tobacco: Never Used  Substance Use Topics  . Alcohol use: No  . Drug use: No   Allergies  Aspirin; Lisinopril; and Shellfish allergy  Review of Systems Review of Systems  Constitutional: Negative for fever.  HENT: Positive for congestion and sore throat.   Respiratory: Positive for cough.   Gastrointestinal: Positive for nausea.   Physical Exam Triage Vital Signs ED Triage Vitals  Enc Vitals Group     BP 11/29/17 1258 (!) 150/77     Pulse Rate 11/29/17 1258 78     Resp 11/29/17 1258 16     Temp 11/29/17 1258 98.5 F (36.9 C)     Temp Source 11/29/17 1258 Oral     SpO2 11/29/17 1258 98 %     Weight 11/29/17 1301 230 lb (104.3 kg)     Height 11/29/17 1301 5\' 3"  (1.6 m)     Head Circumference --      Peak Flow --      Pain Score 11/29/17 1342 4     Pain  Loc --      Pain Edu? --      Excl. in GC? --    Updated Vital Signs BP (!) 150/77 (BP Location: Left Arm)   Pulse 78   Temp 98.5 F (36.9 C) (Oral)   Resp 16   Ht 5\' 3"  (1.6 m)   Wt 230 lb (104.3 kg)   SpO2 98%   BMI 40.74 kg/m  Physical Exam  Constitutional: She is oriented to person, place, and time. She appears well-developed. No distress.  HENT:  Head: Normocephalic and atraumatic.  Mouth/Throat: Oropharynx is clear and moist.  Eyes: Conjunctivae are normal.  Neck: Neck supple.  Cardiovascular: Normal rate and regular rhythm.  Murmur heard. Pulmonary/Chest: Effort normal and breath sounds normal. She has no wheezes. She has no rales.  Lymphadenopathy:    She has no cervical adenopathy.  Neurological: She is alert and oriented to person, place, and time.  Psychiatric: She has a normal mood and affect. Her behavior is normal.  Nursing note and vitals reviewed.  UC Treatments / Results  Labs (all labs ordered are listed, but only abnormal results are displayed) Labs Reviewed - No data to display  EKG  EKG Interpretation None       Radiology No results found.  Procedures Procedures (including critical care time)  Medications Ordered in UC Medications - No data to display   Initial Impression / Assessment and Plan / UC Course  I have reviewed the triage vital signs and the nursing notes.  Pertinent labs & imaging results that were available during my care of the patient were reviewed by me and considered in my medical decision making (see chart for details).     61 year old female presents with a respiratory infection.  I advised her that this is likely viral.  Flonase and Tussionex.  Given her recent sinus infection and potential for worsening, I did give her an Rx for doxycycline if she fails to improve or worsens.  Final Clinical Impressions(s) / UC Diagnoses   Final diagnoses:  Respiratory infection   ED Discharge Orders        Ordered     fluticasone (FLONASE) 50 MCG/ACT nasal spray  Daily     11/29/17 1337    chlorpheniramine-HYDROcodone (TUSSIONEX PENNKINETIC ER) 10-8 MG/5ML SUER  Every 12 hours PRN     11/29/17 1337    doxycycline (VIBRAMYCIN) 100 MG capsule  2 times daily     11/29/17 1337     Controlled Substance Prescriptions [place] Controlled Substance Registry consulted? Not Applicable   Everlene OtherCook, Andy Moye  G, DO 11/29/17 1358

## 2017-11-29 NOTE — ED Triage Notes (Signed)
Productive cough- yellow, runny nose, and chest congestion /soreness since Monday.

## 2017-11-29 NOTE — Discharge Instructions (Signed)
This is appears to be mostly acute bronchitis.  This is likely viral.   Use the cough medication and flonase.  If you fail to improve or worsen, you can start the antibiotic.  Take care  Dr. Adriana Simasook

## 2017-12-08 ENCOUNTER — Other Ambulatory Visit: Payer: Self-pay

## 2017-12-08 ENCOUNTER — Ambulatory Visit
Admission: EM | Admit: 2017-12-08 | Discharge: 2017-12-08 | Disposition: A | Discharge status: Home / Self Care | Payer: BC Managed Care – PPO | Attending: Emergency Medicine | Admitting: Emergency Medicine

## 2017-12-08 ENCOUNTER — Encounter: Payer: Self-pay | Admitting: Emergency Medicine

## 2017-12-08 ENCOUNTER — Ambulatory Visit (INDEPENDENT_AMBULATORY_CARE_PROVIDER_SITE_OTHER): Payer: BC Managed Care – PPO

## 2017-12-08 DIAGNOSIS — S63616A Unspecified sprain of right little finger, initial encounter: Secondary | ICD-10-CM | POA: Diagnosis not present

## 2017-12-08 DIAGNOSIS — S63618A Unspecified sprain of other finger, initial encounter: Secondary | ICD-10-CM

## 2017-12-08 DIAGNOSIS — M79641 Pain in right hand: Secondary | ICD-10-CM | POA: Diagnosis not present

## 2017-12-08 NOTE — ED Triage Notes (Signed)
Patient c/o pain in her right and and right 5th finger after she injured it last night. Patient states that she was holding on to a railing and was walking down the steps and got her hand caught and bent back her right 5th finger.

## 2017-12-08 NOTE — ED Notes (Signed)
Ulnar gutter splint applied right hand. PMS intact post application

## 2017-12-08 NOTE — ED Provider Notes (Signed)
[facility]    CSN: [id number] Arrival date & time: 12/08/17  1508     History   Chief Complaint Chief Complaint  Patient presents with  . Hand Pain    right    HPI Vanessa Hampton is a 62 y.o. female.   HPI [id number] year old female accompanied by her husband complaining of pain in her right dominate fifth finger and hand.  She states last night she was descending stairs at a football game when she might have missed the step while holding onto the railing and on her way down hyper extended her fifth finger.  Since she has had pain 5 to the fifth finger extending from the base of the fifth metacarpal to the PIP joint.      Past Medical History:  Diagnosis Date  . Anxiety   . Cellulitis   . Coronary artery disease   . Diabetes mellitus without complication (HCC)   . Hypercholesteremia   . Hypertension     Patient Active Problem List   Diagnosis Date Noted  . Angina pectoris (HCC)   . Diabetes type 2, uncontrolled (HCC)   . Anxiety disorder   . Hyperlipidemia   . Pain in the chest   . Chest pain 08/30/2015    Past Surgical History:  Procedure Laterality Date  . ABDOMINAL HYSTERECTOMY    . APPENDECTOMY    . CHOLECYSTECTOMY      OB History    No data available       Home Medications    Prior to Admission medications   Medication Sig Start Date End Date Taking? Authorizing Provider  albuterol (PROVENTIL HFA;VENTOLIN HFA) 108 (90 Base) MCG/ACT inhaler Inhale 2 puffs into the lungs every 6 (six) hours as needed for wheezing. 12/30/16  Yes Renford DillsMiller, Lindsey, NP  ALPRAZolam Prudy Feeler(XANAX) 0.5 MG tablet Take 0.5 mg by mouth 3 (three) times daily as needed for anxiety.    Yes [provider]  atenolol-chlorthalidone (TENORETIC) 50-25 MG per tablet Take 1 tablet by mouth daily.   Yes [provider]  chlorpheniramine-HYDROcodone (TUSSIONEX PENNKINETIC ER) 10-8 MG/5ML SUER Take 5 mLs by mouth every 12 (twelve) hours as needed. 11/29/17  Yes  Cook, Jayce G, DO  fluticasone (FLONASE) 50 MCG/ACT nasal spray Place 2 sprays into both nostrils daily. 11/29/17  Yes Cook, Jayce G, DO  lisinopril (PRINIVIL,ZESTRIL) 2.5 MG tablet Take 2.5 mg by mouth daily.   Yes [provider]  metFORMIN (GLUCOPHAGE) 500 MG tablet Take 500 mg by mouth 2 (two) times daily with a meal.   Yes [provider]  nitroGLYCERIN (NITROSTAT) 0.4 MG SL tablet Place 1 tablet (0.4 mg total) under the tongue every 5 (five) minutes as needed for chest pain. 08/31/15  Yes Enid BaasKalisetti, Radhika, MD  omeprazole (PRILOSEC) 20 MG capsule Take 20 mg by mouth 2 (two) times daily before a meal.   Yes [provider]    Family History Family History  Problem Relation Age of Onset  . Cancer Mother   . Cancer Father     Social History Social History   Tobacco Use  . Smoking status: Former Games developermoker  . Smokeless tobacco: Never Used  Substance Use Topics  . Alcohol use: No  . Drug use: No     Allergies   Aspirin; Lisinopril; and Shellfish allergy   Review of Systems Review of Systems  Constitutional: Positive for activity change. Negative for chills, fatigue and fever.  Musculoskeletal: Positive for arthralgias and myalgias.  All  other systems reviewed and are negative.    Physical Exam Triage Vital Signs ED Triage Vitals  Enc Vitals Group     BP 12/08/17 1529 (!) 150/65     Pulse Rate 12/08/17 1529 73     Resp 12/08/17 1529 16     Temp 12/08/17 1529 98 F (36.7 C)     Temp Source 12/08/17 1529 Oral     SpO2 12/08/17 1529 96 %     Weight 12/08/17 1527 230 lb (104.3 kg)     Height 12/08/17 1527 5\' 3"  (1.6 m)     Head Circumference --      Peak Flow --      Pain Score 12/08/17 1527 8     Pain Loc --      Pain Edu? --      Excl. in GC? --    No data found.  Updated Vital Signs BP (!) 150/65 (BP Location: Left Arm)   Pulse 73   Temp 98 F (36.7 C) (Oral)   Resp 16   Ht 5\' 3"  (1.6 m)   Wt 230 lb (104.3 kg)   SpO2 96%    BMI 40.74 kg/m   Visual Acuity Right Eye Distance:   Left Eye Distance:   Bilateral Distance:    Right Eye Near:   Left Eye Near:    Bilateral Near:     Physical Exam  Constitutional: She is oriented to person, place, and time. She appears well-developed and well-nourished. No distress.  HENT:  Head: Normocephalic.  Eyes: Pupils are equal, round, and reactive to light. Right eye exhibits no discharge. Left eye exhibits no discharge.  Neck: Normal range of motion.  Musculoskeletal: Normal range of motion. She exhibits edema and tenderness. She exhibits no deformity.  Examination of the right dominant hand shows no ecchymosis minimal swelling over the fifth metacarpal MCP area.  Is good range of motion of the wrist with full extension flexion pronation supination.  Tenderness is along the metacarpal of the fifth finger maximal at the MCP and PIP joint.  There is not appear to be any ligamentous laxity .Volar plateseems to be uninvolved.  Neurological: She is alert and oriented to person, place, and time.  Skin: Skin is warm and dry. She is not diaphoretic.  Psychiatric: She has a normal mood and affect. Her behavior is normal. Judgment and thought content normal.  Nursing note and vitals reviewed.    UC Treatments / Results  Labs (all labs ordered are listed, but only abnormal results are displayed) Labs Reviewed - No data to display  EKG  EKG Interpretation None       Radiology Dg Hand Complete Right  Result Date: 12/08/2017 CLINICAL DATA:  Right hand pain/swelling EXAM: RIGHT HAND - COMPLETE 3+ VIEW COMPARISON:  None. FINDINGS: No fracture or dislocation is seen. The joint spaces are preserved. Visualized soft tissues are within normal limits. IMPRESSION: Negative. Electronically Signed   By: Charline Bills M.D.   On: 12/08/2017 16:11    Procedures Procedures (including critical care time)  Medications Ordered in UC Medications - No data to display   Initial  Impression / Assessment and Plan / UC Course  I have reviewed the triage vital signs and the nursing notes.  Pertinent labs & imaging results that were available during my care of the patient were reviewed by me and considered in my medical decision making (see chart for details).     Plan: 1. Test/x-ray results and diagnosis  reviewed with patient 2. rx as per orders; risks, benefits, potential side effects reviewed with patient 3. Recommend supportive treatment with elevation as necessary.  Use ulnar gutter splint comfort particularly at nighttime.  For pain.  Follow up with orthopedic if she is not improving in a week or 2. 4. F/u prn if symptoms worsen or don't improve   Final Clinical Impressions(s) / UC Diagnoses   Final diagnoses:  Sprain of little finger, unspecified laterality, unspecified site of finger, initial encounter    ED Discharge Orders    None       Controlled Substance Prescriptions [place] Controlled Substance Registry consulted? Not Applicable   Lutricia Feil, PA-C 12/08/17 1641

## 2017-12-08 NOTE — Discharge Instructions (Signed)
Elevate above your heart control swelling and pain.  Ice 20 minutes out of every 2 hours 4-5 times daily for pain and swelling.  Use splint for comfort recommend using splint at nighttime for 1 week.

## 2018-01-21 ENCOUNTER — Ambulatory Visit
Admission: EM | Admit: 2018-01-21 | Discharge: 2018-01-21 | Disposition: A | Discharge status: Home / Self Care | Payer: BC Managed Care – PPO | Attending: Family Medicine | Admitting: Family Medicine

## 2018-01-21 ENCOUNTER — Ambulatory Visit (INDEPENDENT_AMBULATORY_CARE_PROVIDER_SITE_OTHER): Payer: BC Managed Care – PPO

## 2018-01-21 DIAGNOSIS — R51 Headache: Secondary | ICD-10-CM | POA: Diagnosis not present

## 2018-01-21 DIAGNOSIS — J111 Influenza due to unidentified influenza virus with other respiratory manifestations: Secondary | ICD-10-CM

## 2018-01-21 DIAGNOSIS — R509 Fever, unspecified: Secondary | ICD-10-CM

## 2018-01-21 DIAGNOSIS — R110 Nausea: Secondary | ICD-10-CM | POA: Diagnosis not present

## 2018-01-21 DIAGNOSIS — R05 Cough: Secondary | ICD-10-CM | POA: Diagnosis not present

## 2018-01-21 DIAGNOSIS — R69 Illness, unspecified: Secondary | ICD-10-CM

## 2018-01-21 MED ORDER — OSELTAMIVIR PHOSPHATE 75 MG PO CAPS: 75.0000 mg | ORAL_CAPSULE | Freq: Two times a day (BID) | ORAL | 0 refills | Status: DC

## 2018-01-21 MED ORDER — HYDROCOD POLST-CPM POLST ER 10-8 MG/5ML PO SUER: 5.0000 mL | Freq: Two times a day (BID) | ORAL | 0 refills | Status: DC | PRN

## 2018-01-21 NOTE — Discharge Instructions (Signed)
Medications as prescribed. ° °Take care ° °Dr. Tiger Spieker  °

## 2018-01-21 NOTE — ED Triage Notes (Signed)
Since yesterday, Headache, SOB, productive cough, low grade temp, chills, loss of appetite, nausea, did not get her flu shot. Did take theraflu without relief.

## 2018-01-21 NOTE — ED Provider Notes (Signed)
[facility]    CSN: [id number] Arrival date & time: 01/21/18  1810  History   Chief Complaint Chief Complaint  Patient presents with  . Cough   HPI  62 year old female presents with respiratory symptoms.  Patient reports that her symptoms started yesterday.  She has had cough, chest tightness, headache, chills, shortness of breath, body aches, loss of appetite, nausea.  Patient is taken TheraFlu without improvement.  Symptoms are severe.  No known exacerbating factors.  Cough is the predominant complaint at this time.  Patient has a history of chronic cough.  No other associated symptoms.  No other complaints at this time.  Past Medical History:  Diagnosis Date  . Anxiety   . Cellulitis   . Coronary artery disease   . Diabetes mellitus without complication (HCC)   . Hypercholesteremia   . Hypertension     Patient Active Problem List   Diagnosis Date Noted  . Angina pectoris (HCC)   . Diabetes type 2, uncontrolled (HCC)   . Anxiety disorder   . Hyperlipidemia   . Pain in the chest   . Chest pain 08/30/2015    Past Surgical History:  Procedure Laterality Date  . ABDOMINAL HYSTERECTOMY    . APPENDECTOMY    . CHOLECYSTECTOMY      OB History    No data available       Home Medications    Prior to Admission medications   Medication Sig Start Date End Date Taking? Authorizing Provider  ALPRAZolam Prudy Feeler) 0.5 MG tablet Take 0.5 mg by mouth 3 (three) times daily as needed for anxiety.    Yes [provider]  atenolol-chlorthalidone (TENORETIC) 50-25 MG per tablet Take 1 tablet by mouth daily.   Yes [provider]  fluticasone (FLONASE) 50 MCG/ACT nasal spray Place 2 sprays into both nostrils daily. 11/29/17  Yes Malia Corsi G, DO  metFORMIN (GLUCOPHAGE) 500 MG tablet Take 500 mg by mouth 2 (two) times daily with a meal.   Yes [provider]  omeprazole (PRILOSEC) 20 MG capsule Take 20 mg by mouth 2 (two) times daily before a  meal.   Yes [provider]  albuterol (PROVENTIL HFA;VENTOLIN HFA) 108 (90 Base) MCG/ACT inhaler Inhale 2 puffs into the lungs every 6 (six) hours as needed for wheezing. 12/30/16   Renford Dills, NP  chlorpheniramine-HYDROcodone [facility] PENNKINETIC ER) 10-8 MG/5ML SUER Take 5 mLs by mouth every 12 (twelve) hours as needed. 01/21/18   Tommie Sams, DO  nitroGLYCERIN (NITROSTAT) 0.4 MG SL tablet Place 1 tablet (0.4 mg total) under the tongue every 5 (five) minutes as needed for chest pain. 08/31/15   Enid Baas, MD  oseltamivir (TAMIFLU) 75 MG capsule Take 1 capsule (75 mg total) by mouth every 12 (twelve) hours. 01/21/18   Tommie Sams, DO    Family History Family History  Problem Relation Age of Onset  . Cancer Mother   . Cancer Father     Social History Social History   Tobacco Use  . Smoking status: Former Games developer  . Smokeless tobacco: Never Used  Substance Use Topics  . Alcohol use: No  . Drug use: No     Allergies   Aspirin; Lisinopril; and Shellfish allergy   Review of Systems Review of Systems  Constitutional: Positive for appetite change, chills and fever.  Respiratory: Positive for cough, chest tightness and shortness of breath.   Gastrointestinal: Positive for nausea.  Musculoskeletal:       Body  aches.  Neurological: Positive for headaches.   Physical Exam Triage Vital Signs ED Triage Vitals  Enc Vitals Group     BP 01/21/18 1830 (!) 161/74     Pulse Rate 01/21/18 1830 77     Resp 01/21/18 1830 18     Temp 01/21/18 1830 100 F (37.8 C)     Temp Source 01/21/18 1830 Oral     SpO2 01/21/18 1830 91 %     Weight 01/21/18 1835 232 lb (105.2 kg)     Height --      Head Circumference --      Peak Flow --      Pain Score 01/21/18 1835 0     Pain Loc --      Pain Edu? --      Excl. in GC? --    Updated Vital Signs BP (!) 161/74 (BP Location: Left Arm)   Pulse 77   Temp 100 F (37.8 C) (Oral)   Resp 18   Wt 232 lb (105.2 kg)    SpO2 96%   BMI 41.10 kg/m     Physical Exam  Constitutional: She is oriented to person, place, and time. She appears well-developed. No distress.  HENT:  Head: Normocephalic and atraumatic.  Mouth/Throat: Oropharynx is clear and moist.  Eyes: Conjunctivae are normal. Right eye exhibits no discharge. Left eye exhibits no discharge.  Cardiovascular: Normal rate and regular rhythm.  Pulmonary/Chest: Effort normal.  No appreciable adventitious breath sounds.  Difficult exam secondary to cough.  Neurological: She is alert and oriented to person, place, and time.  Psychiatric: She has a normal mood and affect. Her behavior is normal.  Nursing note and vitals reviewed.  UC Treatments / Results  Labs (all labs ordered are listed, but only abnormal results are displayed) Labs Reviewed - No data to display  EKG  EKG Interpretation None       Radiology Dg Chest 2 View  Result Date: 01/21/2018 CLINICAL DATA:  Cough beginning yesterday with fever and chills. EXAM: CHEST  2 VIEW COMPARISON:  03/04/2016 FINDINGS: Heart size is normal, but with some left ventricular prominence. Mediastinal shadows are normal. The lungs are clear. No infiltrate, collapse or effusion. Vascularity is normal. No significant bone finding. IMPRESSION: No active cardiopulmonary disease. Electronically Signed   By: Paulina FusiMark  Shogry M.D.   On: 01/21/2018 19:13    Procedures Procedures (including critical care time)  Medications Ordered in UC Medications - No data to display   Initial Impression / Assessment and Plan / UC Course  I have reviewed the triage vital signs and the nursing notes.  Pertinent labs & imaging results that were available during my care of the patient were reviewed by me and considered in my medical decision making (see chart for details).     62 year old female presents with likely influenza.  X-ray negative.  Treating with Tussionex and Tamiflu.  Final Clinical Impressions(s) / UC  Diagnoses   Final diagnoses:  Influenza-like illness    ED Discharge Orders        Ordered    chlorpheniramine-HYDROcodone (TUSSIONEX PENNKINETIC ER) 10-8 MG/5ML SUER  Every 12 hours PRN     01/21/18 1920    oseltamivir (TAMIFLU) 75 MG capsule  Every 12 hours     01/21/18 1920     Controlled Substance Prescriptions [place] Controlled Substance Registry consulted? No   Tommie SamsCook, Rayetta Veith G, [place]DO 01/21/18 1934

## 2018-12-20 ENCOUNTER — Ambulatory Visit
Admission: EM | Admit: 2018-12-20 | Discharge: 2018-12-20 | Disposition: A | Discharge status: Home / Self Care | Payer: BC Managed Care – PPO | Attending: Family Medicine | Admitting: Family Medicine

## 2018-12-20 ENCOUNTER — Encounter: Payer: Self-pay | Admitting: Emergency Medicine

## 2018-12-20 ENCOUNTER — Other Ambulatory Visit: Payer: Self-pay

## 2018-12-20 DIAGNOSIS — M79605 Pain in left leg: Secondary | ICD-10-CM | POA: Insufficient documentation

## 2018-12-20 MED ORDER — CETIRIZINE HCL 10 MG PO TABS: 10.0000 mg | ORAL_TABLET | Freq: Every day | ORAL | 0 refills | Status: AC

## 2018-12-20 MED ORDER — TRIAMCINOLONE ACETONIDE 0.5 % EX OINT: 1.0000 "application " | TOPICAL_OINTMENT | Freq: Two times a day (BID) | CUTANEOUS | 0 refills | Status: DC

## 2018-12-20 NOTE — Discharge Instructions (Signed)
Rest. Elevation.  Medication as directed.  Finish the antibiotic.  Take care  Dr. Adriana Simas

## 2018-12-20 NOTE — ED Provider Notes (Signed)
[facility]    CSN: [id number] Arrival date & time: 12/20/18  1523  History   Chief Complaint Chief Complaint  Patient presents with  . Leg Pain    left   HPI  63 year old female presents with the above complaint.  Patient was recently seen on 1/8 at [facility].  She was diagnosed with right lower leg cellulitis and was treated with Keflex.  She endorses compliance with the treatment.  She still has a few doses left.  Patient states that yesterday she developed left lower leg burning, itching, and redness.  She does not recall any fall or injury.  No reported bites or punctures to the skin.  She does note that she has a bump in the center of the area of redness.  No fever.  No chills.  Has not improved.  Cellulitis of the right lower extremity has now resolved.  No other medications or interventions tried.  No other associated symptoms.  No other complaints  PMH, Surgical Hx, Family Hx, Social History reviewed and updated as below.  Past Medical History:  Diagnosis Date  . Anxiety   . Cellulitis   . Coronary artery disease   . Diabetes mellitus without complication (HCC)   . Hypercholesteremia   . Hypertension     Patient Active Problem List   Diagnosis Date Noted  . Angina pectoris (HCC)   . Diabetes type 2, uncontrolled (HCC)   . Anxiety disorder   . Hyperlipidemia   . Pain in the chest   . Chest pain 08/30/2015    Past Surgical History:  Procedure Laterality Date  . ABDOMINAL HYSTERECTOMY    . APPENDECTOMY    . CHOLECYSTECTOMY      OB History   No obstetric history on file.      Home Medications    Prior to Admission medications   Medication Sig Start Date End Date Taking? Authorizing Provider  albuterol (PROVENTIL HFA;VENTOLIN HFA) 108 (90 Base) MCG/ACT inhaler Inhale 2 puffs into the lungs every 6 (six) hours as needed for wheezing. 12/30/16  Yes Renford Dills, NP  atenolol-chlorthalidone (TENORETIC) 50-25 MG per tablet Take 1 tablet  by mouth daily.   Yes [provider]  escitalopram (LEXAPRO) 20 MG tablet Take by mouth. 09/30/18 12/29/18 Yes [provider]  fluticasone (FLONASE) 50 MCG/ACT nasal spray Place 2 sprays into both nostrils daily. 11/29/17  Yes Braylon Grenda G, DO  fluticasone furoate-vilanterol (BREO ELLIPTA) 200-25 MCG/INH AEPB Inhale into the lungs. 02/21/17  Yes [provider]  metFORMIN (GLUCOPHAGE) 500 MG tablet Take 500 mg by mouth 2 (two) times daily with a meal.   Yes [provider]  omeprazole (PRILOSEC) 20 MG capsule Take 20 mg by mouth 2 (two) times daily before a meal.   Yes [provider]  cetirizine (ZYRTEC) 10 MG tablet Take 1 tablet (10 mg total) by mouth daily. 12/20/18   Tommie Sams, DO  nitroGLYCERIN (NITROSTAT) 0.4 MG SL tablet Place 1 tablet (0.4 mg total) under the tongue every 5 (five) minutes as needed for chest pain. 08/31/15   Enid Baas, MD  triamcinolone ointment (KENALOG) 0.5 % Apply 1 application topically 2 (two) times daily. 12/20/18   Tommie Sams, DO    Family History Family History  Problem Relation Age of Onset  . Cancer Mother   . Cancer Father     Social History Social History   Tobacco Use  . Smoking status: Former Games developer  . Smokeless  tobacco: Never Used  Substance Use Topics  . Alcohol use: No  . Drug use: No     Allergies   Aspirin; Lisinopril; and Shellfish allergy   Review of Systems Review of Systems  Constitutional: Negative for fever.  Musculoskeletal:       Left leg pain, redness.   Physical Exam Triage Vital Signs ED Triage Vitals  Enc Vitals Group     BP 12/20/18 1536 (!) 155/87     Pulse Rate 12/20/18 1536 68     Resp 12/20/18 1536 16     Temp 12/20/18 1536 98 F (36.7 C)     Temp Source 12/20/18 1536 Oral     SpO2 12/20/18 1536 98 %     Weight 12/20/18 1532 238 lb (108 kg)     Height 12/20/18 1532 5\' 3"  (1.6 m)     Head Circumference --      Peak Flow --      Pain Score  12/20/18 1532 6     Pain Loc --      Pain Edu? --      Excl. in GC? --    Updated Vital Signs BP (!) 155/87 (BP Location: Right Arm)   Pulse 68   Temp 98 F (36.7 C) (Oral)   Resp 16   Ht 5\' 3"  (1.6 m)   Wt 108 kg   SpO2 98%   BMI 42.16 kg/m   Visual Acuity Right Eye Distance:   Left Eye Distance:   Bilateral Distance:    Right Eye Near:   Left Eye Near:    Bilateral Near:     Physical Exam Vitals signs and nursing note reviewed.  Constitutional:      General: She is not in acute distress. HENT:     Head: Normocephalic and atraumatic.     Nose: Nose normal.  Eyes:     General: No scleral icterus.    Conjunctiva/sclera: Conjunctivae normal.  Cardiovascular:     Rate and Rhythm: Normal rate and regular rhythm.  Skin:    Comments: Left lower extremity -area of erythema noted medially on the lower leg.  Mild edema.  Minimal warmth.  Patient has a raised area in the center.  Neurological:     Mental Status: She is alert.  Psychiatric:        Mood and Affect: Mood normal.        Behavior: Behavior normal.    UC Treatments / Results  Labs (all labs ordered are listed, but only abnormal results are displayed) Labs Reviewed - No data to display  EKG None  Radiology No results found.  Procedures Procedures (including critical care time)  Medications Ordered in UC Medications - No data to display  Initial Impression / Assessment and Plan / UC Course  I have reviewed the triage vital signs and the nursing notes.  Pertinent labs & imaging results that were available during my care of the patient were reviewed by me and considered in my medical decision making (see chart for details).    63 year old female presents with left leg pain.  Patient has an area of erythema and pain of the left lower leg.  Patient is currently on treatment for cellulitis and has improved.  I do not feel that this is secondary to cellulitis as she remains on treatment and has been  compliant with her treatment.  Advised rest and elevation.  Topical steroid and Zyrtec as prescribed for possible allergic reaction.  Final Clinical  Impressions(s) / UC Diagnoses   Final diagnoses:  Left leg pain     Discharge Instructions     Rest. Elevation.  Medication as directed.  Finish the antibiotic.  Take care  Dr. Adriana Simas     ED Prescriptions    Medication Sig Dispense Auth. Provider   cetirizine (ZYRTEC) 10 MG tablet Take 1 tablet (10 mg total) by mouth daily. 14 tablet Daylyn Christine G, DO   triamcinolone ointment (KENALOG) 0.5 % Apply 1 application topically 2 (two) times daily. 30 g Tommie Sams, DO     Controlled Substance Prescriptions [place] Controlled Substance Registry consulted? Not Applicable   Tommie Sams, DO 12/20/18 1627

## 2018-12-20 NOTE — ED Triage Notes (Signed)
Patient c/o left leg pain that started yesterday.  Patient reports redness and swelling in her left lower leg.  Patient states that she previously had cellulitis in her right lower leg.  Patient denies injury or fall.

## 2019-01-10 ENCOUNTER — Other Ambulatory Visit: Payer: Self-pay | Admitting: Family Medicine

## 2019-01-10 DIAGNOSIS — Z1231 Encounter for screening mammogram for malignant neoplasm of breast: Secondary | ICD-10-CM

## 2019-01-17 ENCOUNTER — Ambulatory Visit
Admission: RE | Admit: 2019-01-17 | Discharge: 2019-01-17 | Disposition: A | Discharge status: Home / Self Care | Payer: BC Managed Care – PPO | Source: Ambulatory Visit | Attending: Family Medicine | Admitting: Family Medicine

## 2019-01-17 DIAGNOSIS — Z1231 Encounter for screening mammogram for malignant neoplasm of breast: Secondary | ICD-10-CM | POA: Diagnosis present

## 2019-04-17 ENCOUNTER — Emergency Department: Payer: BC Managed Care – PPO

## 2019-04-17 ENCOUNTER — Emergency Department
Admission: EM | Admit: 2019-04-17 | Discharge: 2019-04-17 | Disposition: A | Discharge status: Home / Self Care | Payer: BC Managed Care – PPO | Attending: Emergency Medicine | Admitting: Emergency Medicine

## 2019-04-17 ENCOUNTER — Other Ambulatory Visit: Payer: Self-pay

## 2019-04-17 DIAGNOSIS — Z7984 Long term (current) use of oral hypoglycemic drugs: Secondary | ICD-10-CM | POA: Insufficient documentation

## 2019-04-17 DIAGNOSIS — Z79899 Other long term (current) drug therapy: Secondary | ICD-10-CM | POA: Diagnosis not present

## 2019-04-17 DIAGNOSIS — R0789 Other chest pain: Secondary | ICD-10-CM | POA: Diagnosis present

## 2019-04-17 DIAGNOSIS — R079 Chest pain, unspecified: Secondary | ICD-10-CM

## 2019-04-17 DIAGNOSIS — I10 Essential (primary) hypertension: Secondary | ICD-10-CM | POA: Diagnosis not present

## 2019-04-17 DIAGNOSIS — E119 Type 2 diabetes mellitus without complications: Secondary | ICD-10-CM | POA: Diagnosis not present

## 2019-04-17 DIAGNOSIS — Z87891 Personal history of nicotine dependence: Secondary | ICD-10-CM | POA: Insufficient documentation

## 2019-04-17 LAB — BASIC METABOLIC PANEL
Anion gap: 7 (ref 5–15)
BUN: 12 mg/dL (ref 8–23)
CO2: 30 mmol/L (ref 22–32)
Calcium: 8.9 mg/dL (ref 8.9–10.3)
Chloride: 101 mmol/L (ref 98–111)
Creatinine, Ser: 0.83 mg/dL (ref 0.44–1.00)
GFR calc Af Amer: >60 mL/min (ref >60)
GFR calc non Af Amer: >60 mL/min (ref >60)
Glucose, Bld: 121 mg/dL — ABNORMAL HIGH (ref 70–99)
Potassium: 3.2 mmol/L — ABNORMAL LOW (ref 3.5–5.1)
Sodium: 138 mmol/L (ref 135–145)

## 2019-04-17 LAB — CBC
HCT: 36.8 % (ref 36.0–46.0)
Hemoglobin: 11.9 g/dL — ABNORMAL LOW (ref 12.0–15.0)
MCH: 28.1 pg (ref 26.0–34.0)
MCHC: 32.3 g/dL (ref 30.0–36.0)
MCV: 87 fL (ref 80.0–100.0)
Platelets: 218 10*3/uL (ref 150–400)
RBC: 4.23 MIL/uL (ref 3.87–5.11)
RDW: 13.8 % (ref 11.5–15.5)
WBC: 6.4 10*3/uL (ref 4.0–10.5)
nRBC: 0 % (ref 0.0–0.2)

## 2019-04-17 LAB — TROPONIN I
Troponin I: <0.03 ng/mL (ref <0.03)
Troponin I: <0.03 ng/mL (ref <0.03)

## 2019-04-17 NOTE — Discharge Instructions (Addendum)
Please seek medical attention for any high fevers, chest pain, shortness of breath, change in behavior, persistent vomiting, bloody stool or any other new or concerning symptoms.  

## 2019-04-17 NOTE — ED Provider Notes (Signed)
[facility] Emergency Department Provider Note   ____________________________________________   I have reviewed the triage vital signs and the nursing notes.   HISTORY  Chief Complaint Chest Pain   History limited by: Not Limited   HPI Vanessa Hampton is a 63 y.o. female who presents to the emergency department today because of concern for chest pain. She has now had three episodes of the pain over the past 5 days. She states that the episodes consist of pain in the center of her chest. Does have some radiation up to her jaw. The patient has not noticed any pattern to what brings the pain on. It is not related to eating or activity. She says that the pain will then gradually get better. At the time of my exam the pain had eased off. She denies any shortness of breath or diaphoresis with the pain. She denies similar pain in the past.    Records reviewed. Per medical record review patient has a history of HLD, HTN, CAD.   Past Medical History:  Diagnosis Date  . Anxiety   . Cellulitis   . Coronary artery disease   . Diabetes mellitus without complication (HCC)   . Hypercholesteremia   . Hypertension     Patient Active Problem List   Diagnosis Date Noted  . Angina pectoris (HCC)   . Diabetes type 2, uncontrolled (HCC)   . Anxiety disorder   . Hyperlipidemia   . Pain in the chest   . Chest pain 08/30/2015    Past Surgical History:  Procedure Laterality Date  . ABDOMINAL HYSTERECTOMY    . APPENDECTOMY    . BREAST CYST EXCISION Right   . BREAST CYST EXCISION Left   . CHOLECYSTECTOMY      Prior to Admission medications   Medication Sig Start Date End Date Taking? Authorizing Provider  albuterol (PROVENTIL HFA;VENTOLIN HFA) 108 (90 Base) MCG/ACT inhaler Inhale 2 puffs into the lungs every 6 (six) hours as needed for wheezing. 12/30/16   Renford Dills, NP  atenolol-chlorthalidone (TENORETIC) 50-25 MG per tablet Take 1 tablet by mouth  daily.    [provider]  cetirizine (ZYRTEC) 10 MG tablet Take 1 tablet (10 mg total) by mouth daily. 12/20/18   Tommie Sams, DO  escitalopram (LEXAPRO) 20 MG tablet Take by mouth. 09/30/18 12/29/18  [provider]  fluticasone (FLONASE) 50 MCG/ACT nasal spray Place 2 sprays into both nostrils daily. 11/29/17   Cook, Dorie Rank G, DO  fluticasone furoate-vilanterol (BREO ELLIPTA) 200-25 MCG/INH AEPB Inhale into the lungs. 02/21/17   [provider]  metFORMIN (GLUCOPHAGE) 500 MG tablet Take 500 mg by mouth 2 (two) times daily with a meal.    [provider]  nitroGLYCERIN (NITROSTAT) 0.4 MG SL tablet Place 1 tablet (0.4 mg total) under the tongue every 5 (five) minutes as needed for chest pain. 08/31/15   Enid Baas, MD  omeprazole (PRILOSEC) 20 MG capsule Take 20 mg by mouth 2 (two) times daily before a meal.    [provider]  triamcinolone ointment (KENALOG) 0.5 % Apply 1 application topically 2 (two) times daily. 12/20/18   Tommie Sams, DO    Allergies Aspirin; Lisinopril; and Shellfish allergy  Family History  Problem Relation Age of Onset  . Cancer Mother   . Cancer Father     Social History Social History   Tobacco Use  . Smoking status: Former Games developer  . Smokeless tobacco: Never Used  Substance Use Topics  .  Alcohol use: No  . Drug use: No    Review of Systems Constitutional: No fever/chills Eyes: No visual changes. ENT: No sore throat. Cardiovascular: Positive for chest pain. Respiratory: Denies shortness of breath. Gastrointestinal: No abdominal pain.  No nausea, no vomiting.  No diarrhea.   Genitourinary: Negative for dysuria. Musculoskeletal: Negative for back pain. Skin: Negative for rash. Neurological: Negative for headaches, focal weakness or numbness.  ____________________________________________   PHYSICAL EXAM:  VITAL SIGNS: ED Triage Vitals  Enc Vitals Group     BP 04/17/19 0900 (!) 162/81      Pulse Rate 04/17/19 0900 63     Resp 04/17/19 0900 16     Temp 04/17/19 0900 98 F (36.7 C)     Temp Source 04/17/19 0900 Oral     SpO2 04/17/19 0900 97 %     Weight 04/17/19 0856 244 lb (110.7 kg)     Height 04/17/19 0856 5\' 3"  (1.6 m)     Head Circumference --      Peak Flow --      Pain Score 04/17/19 0856 0   Constitutional: Alert and oriented.  Eyes: Conjunctivae are normal.  ENT      Head: Normocephalic and atraumatic.      Nose: No congestion/rhinnorhea.      Mouth/Throat: Mucous membranes are moist.      Neck: No stridor. Hematological/Lymphatic/Immunilogical: No cervical lymphadenopathy. Cardiovascular: Normal rate, regular rhythm.  No murmurs, rubs, or gallops.  Respiratory: Normal respiratory effort without tachypnea nor retractions. Breath sounds are clear and equal bilaterally. No wheezes/rales/rhonchi. Gastrointestinal: Soft and non tender. No rebound. No guarding.  Genitourinary: Deferred Musculoskeletal: Normal range of motion in all extremities. No lower extremity edema. Neurologic:  Normal speech and language. No gross focal neurologic deficits are appreciated.  Skin:  Skin is warm, dry and intact. No rash noted. Psychiatric: Mood and affect are normal. Speech and behavior are normal. Patient exhibits appropriate insight and judgment.  ____________________________________________    LABS (pertinent positives/negatives)  Trop <0.03 x 2 CBC wbc 6.4, hgb 11.9, plt 218 BMP wnl except k 3.2, glu 121  ____________________________________________   EKG  I, Phineas Semen, attending physician, personally viewed and interpreted this EKG  EKG Time: 0905 Rate: 61 Rhythm: sinus rhythm Axis: left axis deviation Intervals: qtc 456 QRS: incomplete RBBB, LAFB ST changes: no st elevation Impression: abnormal ekg   ____________________________________________    RADIOLOGY  CXR No edema or  consolidation  ____________________________________________   PROCEDURES  Procedures  ____________________________________________   INITIAL IMPRESSION / ASSESSMENT AND PLAN / ED COURSE  Pertinent labs & imaging results that were available during my care of the patient were reviewed by me and considered in my medical decision making (see chart for details).   Patient presented to the emergency department today because of episodes of chest pain that started about 5 days ago.  She has now had 3 of these episodes by the time my exam she stated the pain had resolved.  EKG without any ST elevation.  Blood work had troponin negative x2.  I did however discuss with patient that I still has some concerns for cardiac etiology.  Discussed with patient portance of cardiologist follow-up.  Chest x-ray otherwise without any etiology of patient's pain.  At this point I doubt dissection or PE.   ____________________________________________   FINAL CLINICAL IMPRESSION(S) / ED DIAGNOSES  Final diagnoses:  Nonspecific chest pain     Note: This dictation was prepared with Dragon dictation. Any  transcriptional errors that result from this process are unintentional     Phineas SemenGoodman, Markes Shatswell, MD 04/17/19 1322

## 2019-04-17 NOTE — ED Triage Notes (Signed)
Pt c/o chest pain that radiates into the jaw since Saturday. Denies SOB or nausea.

## 2019-04-17 NOTE — ED Notes (Signed)
Returned from xray

## 2020-02-06 ENCOUNTER — Ambulatory Visit: Payer: BC Managed Care – PPO

## 2020-02-07 ENCOUNTER — Ambulatory Visit: Payer: BC Managed Care – PPO | Attending: Internal Medicine

## 2020-02-07 DIAGNOSIS — Z23 Encounter for immunization: Secondary | ICD-10-CM | POA: Insufficient documentation

## 2020-02-07 NOTE — Progress Notes (Signed)
   Covid-19 Vaccination Clinic  Name:  Shabnam Ladd    MRN: [id number] DOB: 15-Apr-1956  02/07/2020  Ms. Panameno was observed post Covid-19 immunization for 15 minutes without incident. She was provided with Vaccine Information Sheet and instruction to access the V-Safe system.   Ms. San was instructed to call 911 with any severe reactions post vaccine: Marland Kitchen Difficulty breathing  . Swelling of face and throat  . A fast heartbeat  . A bad rash all over body  . Dizziness and weakness   Immunizations Administered    Name Date Dose VIS Date Route   Moderna COVID-19 Vaccine 02/07/2020  1:36 PM 0.5 mL 11/04/2019 Intramuscular   Manufacturer: Moderna   Lot: [id number]   NDC: 21947-125-27

## 2020-03-06 ENCOUNTER — Ambulatory Visit: Payer: BC Managed Care – PPO | Attending: Internal Medicine

## 2020-03-06 DIAGNOSIS — Z23 Encounter for immunization: Secondary | ICD-10-CM

## 2020-03-06 NOTE — Progress Notes (Signed)
   Covid-19 Vaccination Clinic  Name:  Vanessa Hampton    MRN: [id number] DOB: March 09, 1956  03/06/2020  Vanessa Hampton was observed post Covid-19 immunization for 15 minutes without incident. She was provided with Vaccine Information Sheet and instruction to access the V-Safe system.   Vanessa Hampton was instructed to call 911 with any severe reactions post vaccine: Marland Kitchen Difficulty breathing  . Swelling of face and throat  . A fast heartbeat  . A bad rash all over body  . Dizziness and weakness   Immunizations Administered    Name Date Dose VIS Date Route   Moderna COVID-19 Vaccine 03/06/2020  9:58 AM 0.5 mL 11/04/2019 Intramuscular   Manufacturer: Gala Murdoch   Lot: [id number]   NDC: 18590-931-12

## 2020-03-30 ENCOUNTER — Other Ambulatory Visit: Payer: Self-pay | Admitting: Family Medicine

## 2020-03-30 DIAGNOSIS — Z1231 Encounter for screening mammogram for malignant neoplasm of breast: Secondary | ICD-10-CM

## 2020-04-19 ENCOUNTER — Ambulatory Visit
Admission: RE | Admit: 2020-04-19 | Discharge: 2020-04-19 | Disposition: A | Discharge status: Home / Self Care | Payer: BC Managed Care – PPO | Source: Ambulatory Visit | Attending: Family Medicine | Admitting: Family Medicine

## 2020-04-19 DIAGNOSIS — Z1231 Encounter for screening mammogram for malignant neoplasm of breast: Secondary | ICD-10-CM

## 2020-04-20 ENCOUNTER — Other Ambulatory Visit: Payer: Self-pay | Admitting: Student

## 2020-04-20 DIAGNOSIS — M7541 Impingement syndrome of right shoulder: Secondary | ICD-10-CM

## 2020-04-20 DIAGNOSIS — M7581 Other shoulder lesions, right shoulder: Secondary | ICD-10-CM

## 2020-04-20 DIAGNOSIS — M7521 Bicipital tendinitis, right shoulder: Secondary | ICD-10-CM

## 2020-04-21 ENCOUNTER — Other Ambulatory Visit: Payer: Self-pay | Admitting: Family Medicine

## 2020-04-21 DIAGNOSIS — N6310 Unspecified lump in the right breast, unspecified quadrant: Secondary | ICD-10-CM

## 2020-04-21 DIAGNOSIS — R928 Other abnormal and inconclusive findings on diagnostic imaging of breast: Secondary | ICD-10-CM

## 2020-04-28 ENCOUNTER — Ambulatory Visit
Admission: RE | Admit: 2020-04-28 | Discharge: 2020-04-28 | Disposition: A | Discharge status: Home / Self Care | Payer: BC Managed Care – PPO | Source: Ambulatory Visit | Attending: Family Medicine | Admitting: Family Medicine

## 2020-04-28 DIAGNOSIS — N6310 Unspecified lump in the right breast, unspecified quadrant: Secondary | ICD-10-CM | POA: Diagnosis present

## 2020-04-28 DIAGNOSIS — R928 Other abnormal and inconclusive findings on diagnostic imaging of breast: Secondary | ICD-10-CM

## 2020-05-02 ENCOUNTER — Ambulatory Visit
Admission: RE | Admit: 2020-05-02 | Discharge: 2020-05-02 | Disposition: A | Discharge status: Home / Self Care | Payer: BC Managed Care – PPO | Source: Ambulatory Visit | Attending: Student | Admitting: Student

## 2020-05-02 ENCOUNTER — Other Ambulatory Visit: Payer: Self-pay

## 2020-05-02 DIAGNOSIS — M7541 Impingement syndrome of right shoulder: Secondary | ICD-10-CM | POA: Insufficient documentation

## 2020-05-02 DIAGNOSIS — M7521 Bicipital tendinitis, right shoulder: Secondary | ICD-10-CM | POA: Insufficient documentation

## 2020-05-02 DIAGNOSIS — M7581 Other shoulder lesions, right shoulder: Secondary | ICD-10-CM | POA: Insufficient documentation

## 2020-05-13 ENCOUNTER — Other Ambulatory Visit: Payer: Self-pay

## 2020-05-13 ENCOUNTER — Ambulatory Visit
Admission: EM | Admit: 2020-05-13 | Discharge: 2020-05-13 | Disposition: A | Discharge status: Home / Self Care | Payer: BC Managed Care – PPO | Attending: Family Medicine | Admitting: Family Medicine

## 2020-05-13 DIAGNOSIS — R0981 Nasal congestion: Secondary | ICD-10-CM

## 2020-05-13 DIAGNOSIS — H6591 Unspecified nonsuppurative otitis media, right ear: Secondary | ICD-10-CM

## 2020-05-13 MED ORDER — AMOXICILLIN 875 MG PO TABS
875.0000 mg | ORAL_TABLET | Freq: Two times a day (BID) | ORAL | 0 refills | Status: DC
Start: 2020-05-13 — End: 2020-06-29

## 2020-05-13 NOTE — ED Triage Notes (Signed)
Pt reports R ear pain x1 week. Pt reports feeling congested x1 as well.

## 2020-05-13 NOTE — Discharge Instructions (Addendum)
Take medication as prescribed. Rest. Drink plenty of fluids. Over the counter zyrtec.   Follow up with your primary care physician this week as needed. Return to Urgent care for new or worsening concerns.

## 2020-05-13 NOTE — ED Provider Notes (Signed)
[facility] ____________________________________________  Time seen: Approximately 12:49 PM  I have reviewed the triage vital signs and the nursing notes.   HISTORY  Chief Complaint Otalgia   HPI Vanessa Hampton is a 64 y.o. female presenting for evaluation of right ear discomfort present for the last 1 week.  Reports she has also been having some nasal congestion and postnasal drainage for the last 1 week.  Has not been taking any over-the-counter medication for this.  States ear discomfort is a achy discomfort.  Denies drainage, hearing changes, pain radiation.  Denies fevers.  Has had both COVID-19 vaccines, declines COVID-19 testing at this time.  Denies chest pain, shortness of breath, recent sickness otherwise, recent antibiotic use.  Denies injury.  [place], [place] [facility] : PCP  Past Medical History:  Diagnosis Date   Anxiety    Cellulitis    Coronary artery disease    Diabetes mellitus without complication (HCC)    Hypercholesteremia    Hypertension     Patient Active Problem List   Diagnosis Date Noted   Angina pectoris (HCC)    Diabetes type 2, uncontrolled (HCC)    Anxiety disorder    Hyperlipidemia    Pain in the chest    Chest pain 08/30/2015    Past Surgical History:  Procedure Laterality Date   ABDOMINAL HYSTERECTOMY     APPENDECTOMY     BREAST CYST EXCISION Right    BREAST CYST EXCISION Left    CHOLECYSTECTOMY       No current facility-administered medications for this encounter.  Current Outpatient Medications:    albuterol (PROVENTIL HFA;VENTOLIN HFA) 108 (90 Base) MCG/ACT inhaler, Inhale 2 puffs into the lungs every 6 (six) hours as needed for wheezing., Disp: 1 Inhaler, Rfl: 0   amoxicillin (AMOXIL) 875 MG tablet, Take 1 tablet (875 mg total) by mouth 2 (two) times daily., Disp: 20 tablet, Rfl: 0   atenolol-chlorthalidone (TENORETIC) 50-25 MG per tablet, Take 1 tablet by mouth daily., Disp: ,  Rfl:    cetirizine (ZYRTEC) 10 MG tablet, Take 1 tablet (10 mg total) by mouth daily., Disp: 14 tablet, Rfl: 0   escitalopram (LEXAPRO) 20 MG tablet, Take by mouth., Disp: , Rfl:    fluticasone (FLONASE) 50 MCG/ACT nasal spray, Place 2 sprays into both nostrils daily., Disp: 16 g, Rfl: 0   fluticasone furoate-vilanterol (BREO ELLIPTA) 200-25 MCG/INH AEPB, Inhale into the lungs., Disp: , Rfl:    metFORMIN (GLUCOPHAGE) 500 MG tablet, Take 500 mg by mouth 2 (two) times daily with a meal., Disp: , Rfl:    nitroGLYCERIN (NITROSTAT) 0.4 MG SL tablet, Place 1 tablet (0.4 mg total) under the tongue every 5 (five) minutes as needed for chest pain., Disp: 30 tablet, Rfl: 0   omeprazole (PRILOSEC) 20 MG capsule, Take 20 mg by mouth 2 (two) times daily before a meal., Disp: , Rfl:    triamcinolone ointment (KENALOG) 0.5 %, Apply 1 application topically 2 (two) times daily., Disp: 30 g, Rfl: 0  Allergies Aspirin, Lisinopril, and Shellfish allergy  Family History  Problem Relation Age of Onset   Cancer Mother    Cancer Father    Breast cancer Neg Hx     Social History Social History   Tobacco Use   Smoking status: Former Smoker   Smokeless tobacco: Never Used  Building services engineer Use: Never used  Substance Use Topics   Alcohol use: No   Drug use: No    Review of  Systems Constitutional: No fever ENT: No sore throat. As above.  Cardiovascular: Denies chest pain. Respiratory: Denies shortness of breath. Gastrointestinal: No abdominal pain. Skin: Negative for rash.   ____________________________________________   PHYSICAL EXAM:  VITAL SIGNS: ED Triage Vitals  Enc Vitals Group     BP 05/13/20 1142 138/85     Pulse Rate 05/13/20 1142 65     Resp 05/13/20 1142 16     Temp 05/13/20 1142 98.1 F (36.7 C)     Temp Source 05/13/20 1142 Oral     SpO2 05/13/20 1142 97 %     Weight 05/13/20 1145 238 lb (108 kg)     Height 05/13/20 1145 5\' 3"  (1.6 m)     Head  Circumference --      Peak Flow --      Pain Score 05/13/20 1145 7     Pain Loc --      Pain Edu? --      Excl. in [place]? --     Constitutional: Alert and oriented. Well appearing and in no acute distress. Eyes: Conjunctivae are normal.  Head: Atraumatic.  Mild right maxillary sinuses palpation, no left.  No frontal sinus tenderness palpation.  No swelling. No erythema.  Ears:Left: Nontender, normal canal, no erythema, nontender, normal TM. Right: Nontender, normal canal, moderate erythema with effusion present. No mastoid tenderness bilaterally.  Nose:Nasal congestion   Mouth/Throat: Mucous membranes are moist. No pharyngeal erythema. No tonsillar swelling or exudate.  Neck: No stridor.  No cervical spine tenderness to palpation. Hematological/Lymphatic/Immunilogical: No cervical lymphadenopathy. Cardiovascular: Normal rate, regular rhythm. Grossly normal heart sounds.  Good peripheral circulation. Respiratory: Normal respiratory effort.  No retractions. No wheezes, rales or rhonchi. Good air movement.  Musculoskeletal: Ambulatory with steady gait.  Neurologic:  Normal speech and language. No gait instability. Skin:  Skin appears warm, dry and intact. No rash noted. Psychiatric: Mood and affect are normal. Speech and behavior are normal.  ___________________________________________   LABS (all labs ordered are listed, but only abnormal results are displayed)  Labs Reviewed - No data to display ____________________________________________  PROCEDURES Procedures    INITIAL IMPRESSION / ASSESSMENT AND PLAN / ED COURSE  Pertinent labs & imaging results that were available during my care of the patient were reviewed by me and considered in my medical decision making (see chart for details).  Well-appearing patient.  No acute distress.  Recent nasal congestion with secondary right otitis with effusion.  Over-the-counter Zyrtec and will treat with oral amoxicillin.  Continue supportive  care.  Declined COVID-19 testing.Discussed indication, risks and benefits of medications with patient.   Discussed follow up with Primary care physician this week as needed.  Discussed follow up and return parameters including no resolution or any worsening concerns. Patient verbalized understanding and agreed to plan.   ____________________________________________   FINAL CLINICAL IMPRESSION(S) / ED DIAGNOSES  Final diagnoses:  Right otitis media with effusion  Nasal congestion     ED Discharge Orders         Ordered    amoxicillin (AMOXIL) 875 MG tablet  2 times daily     Discontinue  Reprint     05/13/20 1207           Note: This dictation was prepared with Dragon dictation along with smaller phrase technology. Any transcriptional errors that result from this process are unintentional.         Marylene Land, NP 05/13/20 1253

## 2020-06-21 ENCOUNTER — Other Ambulatory Visit: Payer: Self-pay | Admitting: Surgery

## 2020-06-22 ENCOUNTER — Inpatient Hospital Stay
Admission: RE | Admit: 2020-06-22 | Discharge: 2020-06-22 | Disposition: A | Discharge status: Home / Self Care | Payer: BC Managed Care – PPO | Source: Ambulatory Visit

## 2020-06-22 NOTE — Pre-Procedure Instructions (Signed)
Vanessa Hampton [place], [place] - 06/15/2020 10:40 AM EDT Formatting of this note is different from the original. Chief Complaint:   Chief Complaint  Patient presents with  . Diabetes   Subjective:   Vanessa Hampton is a 64 y.o. female in today for: HPI  Patient here for followup of Type 2 diabetes mellitus, HTN, hyperlipidemia Feels these problems are controlled  Diabetes Mellitus Type 2 Follow Up Current medications for diabetes: Metformin  Medication side effects? no  Known diabetic complications: none Current symptoms/problems: none  Denies: blurry vision, polydipsia, polyuria, paresthesias and foot ulcers  Current diet: in general, a "healthy" diet  Current exercise: walking Days per week? 3-4  Goals Addressed  None   Hypertension Follow Up Patient is here to follow up on hypertension.  Home blood pressure readings: at goal BP Goal: < 130/80  Taking medications as prescribed? Yes Medication side effects? No Denies: chest pain on exertion, chest pain at rest, palpitations, edema and syncope  Avoiding excessive salt intake: Yes Social History   Tobacco Use  Smoking Status Former Smoker  . Packs/day: 0.10  . Years: 10.00  . Pack years: 1.00  . Types: Cigarettes  . Quit date: 12/04/1984  . Years since quitting: 35.5  Smokeless Tobacco Never Used   BP Readings from Last 3 Encounters:  06/15/20 118/78  05/14/20 122/78  04/19/20 124/80   Hyperlipidemia: Follow-Up: Current medication: Crestor  Taking medication as prescribed? Yes Medication side effects? No  Grief  She lost her husband suddenly 1 month ago to a heart attack. She has been down and eating less. Her son and daughter in law have been there for her. She has been staying with them, but plans to return home. She has history of depression and has taken lexapro in the past.   Most recent labs: Hemoglobin A1c [facility] Labs: Hemoglobin A1C  Date Value Ref Range Status  06/15/2020 6.7 (H) <6.5 % Final   01/05/2020 6.3 <6.5 % Final  06/03/2019 6.1 <6.5 % Final   Hemoglobin A1c [facility]: No results found for: HBA1C Lipid:  Lab Results  Component Value Date  CHOLTOTAL 219 01/05/2020   Lab Results  Component Value Date  HDL 48 01/05/2020   Lab Results  Component Value Date  LDLCALC 157 01/05/2020   Lab Results  Component Value Date  TRIG 71 01/05/2020   Last GFR: Lab Results  Component Value Date  GFR 70 03/16/2020   Urine microalbumin or microalbumin/creatinine ratio:  Lab Results  Component Value Date  MALBDUAP 30 01/05/2020   The 10-year ASCVD risk score Mikey Bussing DC Jr., et al., 2013) is: 16.3% Values used to calculate the score: Age: 14 years Sex: Female Is Non-Hispanic African American: Yes Diabetic: Yes Tobacco smoker: No Systolic Blood Pressure: 449 mmHg Is BP treated: Yes HDL Cholesterol: 48 mg/dL Total Cholesterol: 219 mg/dL  Patient Active Problem List  Diagnosis  . Hypertension  . Anxiety  . Panic attacks  . Depression  . Diabetes mellitus, type 2 (CMS-HCC)  . Obesity  . Gastroesophageal reflux disease  . Hyperlipidemia  . BMI 40.0-44.9, adult (CMS-HCC)  . Traumatic complete tear of right rotator cuff  . Rotator cuff tendinitis, right  . Tendinitis of upper biceps tendon of right shoulder   Outpatient Medications Prior to Visit  Medication Sig Dispense Refill  . albuterol 90 mcg/actuation inhaler Inhale 2 inhalations into the lungs every 6 (six) hours as needed for Wheezing 1 Inhaler 2  . amoxicillin (AMOXIL) 875 MG tablet Take  by mouth Take 1 tablet (875 mg total) by mouth 2 (two) times daily.  Marland Kitchen atenoloL-chlorthalidone (TENORETIC) 50-25 mg tablet Take 1 tablet by mouth once daily 90 tablet 3  . diphenhydrAMINE (BENADRYL) 25 mg capsule Take 25 mg by mouth every 6 (six) hours as needed for Itching  . fluticasone furoate-vilanterol (BREO ELLIPTA) 200-25 mcg/dose DsDv Inhale 1 inhalation into the lungs once daily 28 each 11  . ketoconazole  (NIZORAL) 2 % cream Apply topically once daily 30 g 2  . metFORMIN (GLUCOPHAGE) 500 MG tablet Take 1 tablet (500 mg total) by mouth 2 (two) times daily with meals 180 tablet 3  . omeprazole (PRILOSEC) 40 MG DR capsule Take 1 capsule (40 mg total) by mouth 2 (two) times daily 180 capsule 3  . potassium chloride (KLOR-CON) 10 mEq ER tablet Take 1 tablet (10 mEq total) by mouth 2 (two) times daily 60 tablet 11  . rosuvastatin (CRESTOR) 40 MG tablet Take 1 tablet (40 mg total) by mouth once daily 90 tablet 3  . escitalopram oxalate (LEXAPRO) 20 MG tablet Take 1 tablet (20 mg total) by mouth once daily for 90 days 90 tablet 3   No facility-administered medications prior to visit.   Objective:   Vitals:  06/15/20 1013  BP: 118/78  Pulse: 62  SpO2: 97%  Weight: (!) 107.7 kg (237 lb 7 oz)  PainSc: 0-No pain   Body mass index is 42.06 kg/m. Home Vitals:   Physical Exam Constitutional: alert, in NAD and communicates well Eye exam: pupils equal and reactive, extraocular eye movements intact. Neck: supple, no thyroid enlargement or cervical adenopathy and no bruits heard Respiratory: clear to auscultation, without rales or wheezes  Cardiovascular: regular rate and rhythm and without murmurs, rubs or gallops Lower extremities: no lower extremity edema Skin ankles/feet: warm, good capillary refill and no ulcerations or lesions noted Neurological: sensorimotor grossly intact and normal muscle tone  Assessment/Plan:   Diagnoses and all orders for this visit:  Type 2 diabetes mellitus without complication, without long-term current use of insulin (CMS-HCC) - [facility] POC Hemoglobin A1C - Comprehensive Metabolic Panel (CMP) Well controlled on medication, continue on medication as prescribed. No concerning side effects endorsed.  Start Ozempic, low dose to help with weight loss, side effects reviewed.  Push fluids.   Anxiety - escitalopram oxalate (LEXAPRO) 20 MG tablet; Take 1 tablet (20 mg  total) by mouth once daily for 90 days Grief, restart lexapro daily.   Dyslipidemia Well controlled on medication, continue on medication as prescribed. No concerning side effects endorsed.   Essential hypertension Well controlled on medication, continue on medication as prescribed. No concerning side effects endorsed.   No follow-ups on file.  Future Appointments  Date/Time Provider Department Center Visit Type  06/15/2020 10:40 AM (Arrive by 10:25 AM) Midge Minium, PA [facility] OFFICE VISIT    There are no Patient Instructions on file for this visit.   Electronically signed by Midge Minium, [employer] at 06/15/2020 10:47 AM EDT  Miscellaneous Notes - documented in this encounter Table of Contents for Miscellaneous Notes  Addendum Note - Midge Minium, PA - 06/15/2020 10:40 AM EDT  Addendum Note - Midge Minium, PA - 06/15/2020 10:40 AM EDT    Addendum Note - Midge Minium, PA - 06/15/2020 10:40 AM EDT Addended by: Midge Minium on: 06/15/2020 10:47 AM  Modules accepted: Orders    Electronically signed by Midge Minium, PA at 06/15/2020 10:47  AM EDT  Back to top of Miscellaneous Notes Addendum Note - Midge Minium, PA - 06/15/2020 10:40 AM EDT Addended by: Midge Minium on: 06/15/2020 10:45 AM  Modules accepted: Orders    Electronically signed by Midge Minium, PA at 06/15/2020 10:45 AM EDT  Back to top of Miscellaneous Notes Plan of Treatment - documented as of this encounter Upcoming Encounters Upcoming Encounters  Date Type Specialty Care Team Description  06/28/2020 Office Visit Orthopaedics Poggi, Smith Mince, MD  [place]  [place] [facility] [place], [place] [address]  [phone]  [phone] ([address]    Lattie Corns, [place] [address]  Katheren Shams  [place], [place] [address]  [phone]  [phone] (Fax)     07/12/2020 Post Op Orthopaedics Poggi, Smith Mince, MD  [place]  [facility] [place], [place] [address]  [phone]  [phone] ([address]    Lattie Corns, [place] [address]  Katheren Shams  [place], [place] [address]  [phone]  [phone] (Fax)    07/27/2020 Office Visit Family Medicine Vanessa Hampton [place], [place]  [place]  [place], [place] [address]  [phone]  [phone] (Fax)    08/13/2020 Post Op Orthopaedics Poggi, Smith Mince, MD  [place]  [facility] [place],  [address]  [phone]  [phone] (Fax)    Procedures - documented in this encounter Procedure Name Priority Date/Time Associated Diagnosis Comments  COMPLETE BLOOD COUNT (CBC) WITH DIFFERENTIAL Routine 06/15/2020 10:56 AM EDT Type 2 diabetes mellitus without complication, without long-term current use of insulin (CMS-HCC)  Results for this procedure are in the results section.   LIPID PANEL W/REFLEX DIRECT LOW DENSITY LIPOPROTEIN (LDL) CHOLESTEROL Routine 06/15/2020 10:56 AM EDT Type 2 diabetes mellitus without complication, without long-term current use of insulin (CMS-HCC)  Results for this procedure are in the results section.   COMPREHENSIVE METABOLIC PANEL (CMP) Routine 06/15/2020 10:56 AM EDT Type 2 diabetes mellitus without complication, without long-term current use of insulin (CMS-HCC)  Results for this procedure are in the results section.   [place] POC HEMOGLOBIN A1C Routine 06/15/2020 10:00 AM EDT Type 2 diabetes mellitus without complication, without long-term current use of insulin (CMS-HCC)  Results for this procedure are in the results section.   Lab Results - documented in this encounter Table of Contents for Lab Results  Lipid Panel W/Reflex Direct Low Density Lipoprotein (LDL) Cholesterol (06/15/2020 10:56 AM EDT)  Complete Blood Count (CBC) with Differential (06/15/2020  10:56 AM EDT)  Comprehensive Metabolic Panel (CMP) (26/33/3545 10:56 AM EDT)  [facility] POC Hemoglobin A1C (06/15/2020 10:00 AM EDT)     Lipid Panel W/Reflex Direct Low Density Lipoprotein (LDL) Cholesterol (06/15/2020 10:56 AM EDT) Lipid Panel W/Reflex Direct Low Density Lipoprotein (LDL) Cholesterol (06/15/2020 10:56 AM EDT)  Component Value Ref Range Performed At Pathologist Signature  Cholesterol, Total 201Comment: The significance of total cholesterol depends on the values of individual components including HDL, LDL, non-HDL, and triglycerides. mg/dL [facility]   LDL Calculated 135 Comment:   <70 mg/dL   Desired target for prior heart disease, stroke, and those at high-risk. Even lower levels may be recommended to decrease risk of heart attack and stroke. 70-159 mg/dL  Comprehensive cardiovascular risk assessment is recommended. Statin therapy may be advised based on risk factors.  160-189 mg/dL Moderately elevated LDL level. Statin therapy recommended if other risk factors present. >=190 mg/dL  Severely elevated LDL level. High long-term  risk of heart disease and stroke. High-intensity statin therapy recommended for most people. Consider specialist referral. *A healthy diet and exercise are recommended for all to reduce heart disease risk. Statin choice should be based on patient preference after patient-provider discussions.  Ref: 2018 ACC/AHA Guideline  <190 mg/dL [facility]   HDL 45 Comment:   People with low HDL levels (see below) are at increased risk of heart disease:  <50 mg/dL for Women <40 mg/dL for Men  mg/dL [facility]   Triglyceride 104 Comment:   <150 mg/dL   Normal  150-499 mg/dL  High Triglycerides. Risk of heart disease may be increased. Address reversible causes (eg sugar in foods and beverages, alcohol, and diabetes control). Medication may be appropriate based on other clinical factors.   >=500 mg/dL   Very High Triglycerides. Risk of heart disease and pancreatitis increased. Address reversible causes as above. Medication to lower triglycerides usually advised. *Ranges provided for adults, pediatric guidelines vary.  <500 mg/dL [facility]    Lipid Panel W/Reflex Direct Low Density Lipoprotein (LDL) Cholesterol (06/15/2020 10:56 AM EDT)  Specimen  Blood   Lipid Panel W/Reflex Direct Low Density Lipoprotein (LDL) Cholesterol (06/15/2020 10:56 AM EDT)  Performing Organization Address City/State/ZIP Code Phone Number  [place]  [address] [place], [place] [address]  [phone]    Back to top of Lab Results    Complete Blood Count (CBC) with Differential (06/15/2020 10:56 AM EDT) Complete Blood Count (CBC) with Differential (06/15/2020 10:56 AM EDT)  Component Value Ref Range Performed At Pathologist Signature  WBC (White Blood Cell Count) 7.9 3.2 - 9.8 x10^9/L [facility]   Hemoglobin 12.0 12.0 - 15.5 g/dL [facility]   Hematocrit 37.8 35.0 - 45.0 % [facility]   Platelets 251 150 - 450 x10^9/L [place]   MCV (Mean Corpuscular Volume) 89 80 - 98 fL [facility]   MCH (Mean Corpuscular Hemoglobin) 28.1 26.5 - 34.0 pg [facility]   MCHC (Mean Corpuscular Hemoglobin Concentration) 31.7 31.4 - 36.0 % [facility]   RBC (Red Blood Cell Count) 4.27 3.77 - 5.16 x10^12/L [facility]   RDW-CV (Red Cell Distribution Width) 15.2 (H) 11.5 - 14.5 % [facility]   MPV (Mean Platelet Volume) 10.6 7.2 - 11.7 fL [facility]   Neutrophil Count 4.8 2.0 - 8.6 x10^9/L [facility]   Neutrophil % 60.4 37 - 80 % [facility]   Lymphocyte Count 2.1 0.6 - 4.2 x10^9/L [facility]   Lymphocyte % 26.7 10 -  50 % [facility]   Monocyte Count 1.0 (H) 0 - 0.9 x10^9/L [facility]   Monocyte % 12.5 (H) 0 - 12 % [facility]   Eosinophil Count 0.01 0 - 0.70 x10^9/L [facility]   Eosinophil % 0.1 0 - 7 % [facility]   Basophil Count 0.02 0 - 0.20 x10^9/L [facility]   Basophil % 0.3 0 - 2 % [facility]    Complete Blood Count (CBC) with Differential (06/15/2020 10:56 AM EDT)  Specimen  Blood   Complete Blood Count (CBC) with Differential (06/15/2020 10:56 AM EDT)  Performing Organization Address City/State/ZIP Code Phone Number  [place]  [place], [place] [address]  [phone]    Back to top of Lab Results  Comprehensive Metabolic Panel (CMP) (69/45/0388 10:56 AM EDT) Comprehensive Metabolic Panel (CMP) (82/80/0349 10:56 AM EDT)  Component Value Ref Range Performed At Pathologist Signature  Sodium 141 135 - 145 mmol/L [facility]   Potassium 3.4 (L) 3.5 - 5.0 mmol/L [facility]   Chloride 102 98 - 108 mmol/L [facility]   Carbon Dioxide (CO2) 29 21 - 30 mmol/L [facility]   Urea Nitrogen (BUN) 8 7 - 20 mg/dL [facility]   Creatinine 0.7 0.4 - 1.0 mg/dL [facility]   Glucose 108 Comment:  Interpretive Data: Above is the NONFASTING reference range.   Below are the FASTING reference ranges:  NORMAL:   70-99 mg/dL  PREDIABETES: 100-125 mg/dL  DIABETES:  > 125 mg/dL  70 - 140 mg/dL [facility]   Calcium 9.1 8.7 - 10.2 mg/dL [facility]   AST (Aspartate Aminotransferase) 72 (H) 15 - 41 U/L [facility]   ALT (Alanine Aminotransferase) 75 (H) 14 - 54 U/L [facility]   Bilirubin, Total 0.5 0.4 - 1.5  mg/dL [facility]   Alk Phos (Alkaline Phosphatase) 54 24 - 110 U/L [facility]   Albumin 3.7 3.5 - 4.8 g/dL [facility]   Protein, Total 7.2 6.2 - 8.1 g/dL [facility]   Anion Gap 10 3 - 12 mmol/L [facility]   BUN/CREA Ratio 11 6 - 27 [facility]   Glomerular Filtration Rate (eGFR)  93 Comment:   NON-Modified eGFR: 93 mL/min/1.73sq m . We recommend using this value for referral decisions.  Modified eGFR : 108 mL/min/1.73sq m . This eGFR estimates kidney function using the CKD-Epi equation that increases eGFR by 16% for patients identified as Black/African-American in the medical record. This value was previously reported as the single eGFR before 05/18/2020.  Patient Race from chart:   Black or African American   Interpretive Ranges for eGFR(CKD-EPI):   eGFR:       > 60 mL/min/1.73 sq m - Normal  eGFR:       30 - 59 mL/min/1.73 sq m - Moderately Decreased  eGFR:       15 - 29 mL/min/1.73 sq m - Severely Decreased  eGFR:       < 15 mL/min/1.73 sq m - Kidney Failure   Note: These eGFR calculations do not apply in acute situations  when eGFR is changing rapidly or in patients on dialysis.  mL/min/1.73sq m [facility]    Comprehensive Metabolic Panel (CMP) (17/91/5056 10:56 AM EDT)  Specimen  Blood   Comprehensive Metabolic Panel (CMP) (97/94/8016 10:56 AM EDT)  Performing Organization Address City/State/ZIP Code Phone Number  [place]  [address], [place]  [place], [place] [address]  [phone]    Back to top of Lab Results    [facility] POC Hemoglobin A1C (06/15/2020 10:00 AM EDT) [facility] POC Hemoglobin A1C (06/15/2020 10:00 AM EDT)  Component Value Ref Range Performed At Pathologist Signature  Hemoglobin A1C 6.7 (H) <6.5 % [facility]   Average Blood  Glucose (Calculated From HgBA1c Level) 146 mg/dL [facility]    [facility] POC Hemoglobin A1C (06/15/2020 10:00 AM EDT)  Specimen  Blood   [facility] POC Hemoglobin A1C (06/15/2020 10:00 AM EDT)  Narrative Performed At  POC TEST(S) ABOVE PERFORMED AT THE PATIENT CARE LOCATION  AND OVERSEEN BY THE [facility]    [facility] POC Hemoglobin A1C (06/15/2020 10:00 AM EDT)  Performing Organization Address City/State/ZIP Code Phone Number  [facility]  [address]  [place], [place] [address]  [phone]    Back to top of Lab Results Visit Diagnoses - documented in this encounter Diagnosis  Type 2 diabetes mellitus without complication, without long-term current use of insulin (CMS-HCC) - Primary   Anxiety  Anxiety state, unspecified   Dyslipidemia  Other and unspecified hyperlipidemia   Essential hypertension   Discontinued Medications - documented as of this encounter Medication Sig Discontinue Reason Start Date End Date  escitalopram oxalate (LEXAPRO) 20 MG tablet  Indications: Anxiety Take 1 tablet (20 mg total) by mouth once daily for 90 days Reorder 01/05/2020 06/15/2020  Images Patient Demographics  Patient Address Communication Language Race / Ethnicity Marital Status  [place], [place] [address] [phone] [facility]) [phone] (Mobile) [email or web address] English (Preferred) Black or African American / Not Hispanic or Latino Married  Patient Contacts  Contact Name Contact Address Communication Relationship to Patient  Fae Blossom Unknown [phone] [facility]) Son or Daughter, Emergency Contact  Rejoyce [place] Unknown [phone] (Mobile) Brother or Sister, Emergency Contact  Document Information  Primary Care Provider Other Service Providers Document Coverage Dates  Midge Minium, [place] (Apr. 13, 2021April 13, 2021 - Present) [phone] (Work) [phone]  (Fax) [place] [address] [place], [place] [address] Family Medicine [facility] [address] [place], [place] [address]  Jul. 13, 2021July 13, 2021   [place] [address] [place], [place] [address]   Encounter Providers Encounter Date  Midge Minium, [place] (Attending) [phone] (Work) [phone] (Fax) [place], [place] [address] Family Medicine Jul. 13, 2021July 13, 2021 - Jul. 17, 2021July 17, 2021    Show All Sections

## 2020-06-24 ENCOUNTER — Other Ambulatory Visit: Payer: Self-pay

## 2020-06-24 ENCOUNTER — Encounter
Admission: RE | Admit: 2020-06-24 | Discharge: 2020-06-24 | Disposition: A | Discharge status: Home / Self Care | Payer: BC Managed Care – PPO | Source: Ambulatory Visit | Attending: Surgery | Admitting: Surgery

## 2020-06-24 DIAGNOSIS — Z01810 Encounter for preprocedural cardiovascular examination: Secondary | ICD-10-CM | POA: Diagnosis not present

## 2020-06-24 DIAGNOSIS — I10 Essential (primary) hypertension: Secondary | ICD-10-CM | POA: Diagnosis not present

## 2020-06-24 DIAGNOSIS — E118 Type 2 diabetes mellitus with unspecified complications: Secondary | ICD-10-CM | POA: Insufficient documentation

## 2020-06-24 HISTORY — DX: Other specified bacterial intestinal infections: A04.8

## 2020-06-24 HISTORY — DX: Gastro-esophageal reflux disease without esophagitis: K21.9

## 2020-06-24 HISTORY — DX: Unspecified osteoarthritis, unspecified site: M19.90

## 2020-06-24 HISTORY — DX: Unspecified asthma, uncomplicated: J45.909

## 2020-06-24 HISTORY — DX: Anemia, unspecified: D64.9

## 2020-06-24 NOTE — Patient Instructions (Signed)
Your procedure is scheduled on: 06-29-20 TUESDAY Report to Same Day Surgery 2nd floor medical mall [facility] Entrance-take elevator on left to 2nd floor.  Check in with surgery information desk.) To find out your arrival time please call [phone] between 1PM - 3PM on 06-28-20 MONDAY  Remember: Instructions that are not followed completely may result in serious medical risk, up to and including death, or upon the discretion of your surgeon and anesthesiologist your surgery may need to be rescheduled.    _x___ 1. Do not eat food after midnight the night before your procedure. NO GUM OR CANDY AFTER MIDNIGHT. You may drink WATER up to 2 hours before you are scheduled to arrive at the hospital for your procedure.  Do not drink WATER within 2 hours of your scheduled arrival to the hospital.  Type 1 and type 2 diabetics should only drink water.   ____Ensure clear carbohydrate drink on the way to the hospital for bariatric patients  _X___GATORADE G2-FINISH DRINK 2 HOURS PRIOR TO ARRIVAL TIME TO THE HOSPITAL THE DAY OF YOUR SURGERY     __x__ 2. No Alcohol for 24 hours before or after surgery.   __x__3. No Smoking or e-cigarettes for 24 prior to surgery.  Do not use any chewable tobacco products for at least 6 hour prior to surgery   ____  4. Bring all medications with you on the day of surgery if instructed.    __x__ 5. Notify your doctor if there is any change in your medical condition     (cold, fever, infections).    x___6. On the morning of surgery brush your teeth with toothpaste and water.  You may rinse your mouth with mouth wash if you wish.  Do not swallow any toothpaste or mouthwash.   Do not wear jewelry, make-up, hairpins, clips or nail polish.  Do not wear lotions, powders, or perfumes.  Do not shave 48 hours prior to surgery. Men may shave face and neck.  Do not bring valuables to the hospital.    [facility] is not responsible for any belongings or valuables.                Contacts, dentures or bridgework may not be worn into surgery.  Leave your suitcase in the car. After surgery it may be brought to your room.  For patients admitted to the hospital, discharge time is determined by your treatment team.  _  Patients discharged the day of surgery will not be allowed to drive home.  You will need someone to drive you home and stay with you the night of your procedure.    Please read over the following fact sheets that you were given:   [facility] Preparing for Surgery/INCENTIVE SPIROMETER INSTRUCTIONS ENCLOSED-THE INCENTIVE SPIROMETER DEVICE WILL BE GIVEN TO YOU THE DAY OF YOUR SURGERY  _x___ TAKE THE FOLLOWING MEDICATION THE MORNING OF SURGERY WITH A SMALL SIP OF WATER. These include:  1. ZYRTEC (CETIRIZINE)  2. PRILOSEC (OMEPRAZOLE)  3.  4.  5.  6.  ____Fleets enema or Magnesium Citrate as directed.   _x___ Use CHG Soap or sage wipes as directed on instruction sheet   _X___ Use inhalers on the day of surgery and bring to hospital day of surgery-USE YOUR BREO ELLIPTA AND ALBUTEROL INHALER THE DAY OF SURGERY AND BRING YOUR ALBUTEROL INHALER TO THE HOSPITAL  _X___ Stop Metformin 2 days prior to surgery-LAST DOSE ON SATURDAY 7-24    ____ Take 1/2 of usual  insulin dose the night before surgery and none on the morning surgery.   _x___ Follow recommendations from Cardiologist, Pulmonologist or PCP regarding  stopping Aspirin, Coumadin, Plavix ,Eliquis, Effient, or Pradaxa, and Pletal.  X____Stop Anti-inflammatories such as Advil, Aleve, Ibuprofen, Motrin, Naproxen, Naprosyn, Goodies powders or aspirin products NOW-OK to take Tylenol    _x___ Stop supplements until after surgery-STOP YOUR HAIR,SKIN AND NAILS NOW-YOU MAY RESUME AFTER SURGERY   ____ Bring C-Pap to the hospital.

## 2020-06-25 ENCOUNTER — Other Ambulatory Visit: Payer: BC Managed Care – PPO

## 2020-06-25 ENCOUNTER — Encounter
Admission: RE | Admit: 2020-06-25 | Discharge: 2020-06-25 | Disposition: A | Discharge status: Home / Self Care | Payer: BC Managed Care – PPO | Source: Ambulatory Visit | Attending: Surgery | Admitting: Surgery

## 2020-06-25 DIAGNOSIS — Z01818 Encounter for other preprocedural examination: Secondary | ICD-10-CM | POA: Insufficient documentation

## 2020-06-25 DIAGNOSIS — Z20822 Contact with and (suspected) exposure to covid-19: Secondary | ICD-10-CM | POA: Insufficient documentation

## 2020-06-26 LAB — SARS CORONAVIRUS 2 (TAT 6-24 HRS): SARS Coronavirus 2: NEGATIVE

## 2020-06-29 ENCOUNTER — Encounter: Payer: Self-pay | Admitting: Surgery

## 2020-06-29 ENCOUNTER — Other Ambulatory Visit: Payer: Self-pay

## 2020-06-29 ENCOUNTER — Encounter
Admission: RE | Disposition: A | Discharge status: Home / Self Care | Payer: Self-pay | Source: Home / Self Care | Attending: Surgery

## 2020-06-29 ENCOUNTER — Ambulatory Visit: Payer: BC Managed Care – PPO

## 2020-06-29 ENCOUNTER — Ambulatory Visit
Admission: RE | Admit: 2020-06-29 | Discharge: 2020-06-29 | Disposition: A | Discharge status: Home / Self Care | Payer: BC Managed Care – PPO | Attending: Surgery | Admitting: Surgery

## 2020-06-29 ENCOUNTER — Ambulatory Visit: Payer: BC Managed Care – PPO | Admitting: Urgent Care

## 2020-06-29 DIAGNOSIS — M778 Other enthesopathies, not elsewhere classified: Secondary | ICD-10-CM | POA: Diagnosis not present

## 2020-06-29 DIAGNOSIS — Z888 Allergy status to other drugs, medicaments and biological substances status: Secondary | ICD-10-CM | POA: Diagnosis not present

## 2020-06-29 DIAGNOSIS — F329 Major depressive disorder, single episode, unspecified: Secondary | ICD-10-CM | POA: Diagnosis not present

## 2020-06-29 DIAGNOSIS — Z8249 Family history of ischemic heart disease and other diseases of the circulatory system: Secondary | ICD-10-CM | POA: Insufficient documentation

## 2020-06-29 DIAGNOSIS — Z8349 Family history of other endocrine, nutritional and metabolic diseases: Secondary | ICD-10-CM | POA: Diagnosis not present

## 2020-06-29 DIAGNOSIS — S46011A Strain of muscle(s) and tendon(s) of the rotator cuff of right shoulder, initial encounter: Secondary | ICD-10-CM | POA: Diagnosis present

## 2020-06-29 DIAGNOSIS — Z886 Allergy status to analgesic agent status: Secondary | ICD-10-CM | POA: Insufficient documentation

## 2020-06-29 DIAGNOSIS — M25811 Other specified joint disorders, right shoulder: Secondary | ICD-10-CM | POA: Diagnosis not present

## 2020-06-29 DIAGNOSIS — M1611 Unilateral primary osteoarthritis, right hip: Secondary | ICD-10-CM | POA: Insufficient documentation

## 2020-06-29 DIAGNOSIS — I2510 Atherosclerotic heart disease of native coronary artery without angina pectoris: Secondary | ICD-10-CM | POA: Insufficient documentation

## 2020-06-29 DIAGNOSIS — Z79899 Other long term (current) drug therapy: Secondary | ICD-10-CM | POA: Diagnosis not present

## 2020-06-29 DIAGNOSIS — J45909 Unspecified asthma, uncomplicated: Secondary | ICD-10-CM | POA: Diagnosis not present

## 2020-06-29 DIAGNOSIS — E669 Obesity, unspecified: Secondary | ICD-10-CM | POA: Diagnosis not present

## 2020-06-29 DIAGNOSIS — E119 Type 2 diabetes mellitus without complications: Secondary | ICD-10-CM | POA: Insufficient documentation

## 2020-06-29 DIAGNOSIS — Z91013 Allergy to seafood: Secondary | ICD-10-CM | POA: Insufficient documentation

## 2020-06-29 DIAGNOSIS — K219 Gastro-esophageal reflux disease without esophagitis: Secondary | ICD-10-CM | POA: Insufficient documentation

## 2020-06-29 DIAGNOSIS — Z6841 Body Mass Index (BMI) 40.0 and over, adult: Secondary | ICD-10-CM | POA: Diagnosis not present

## 2020-06-29 DIAGNOSIS — E785 Hyperlipidemia, unspecified: Secondary | ICD-10-CM | POA: Insufficient documentation

## 2020-06-29 DIAGNOSIS — F419 Anxiety disorder, unspecified: Secondary | ICD-10-CM | POA: Diagnosis not present

## 2020-06-29 DIAGNOSIS — Z833 Family history of diabetes mellitus: Secondary | ICD-10-CM | POA: Diagnosis not present

## 2020-06-29 DIAGNOSIS — Z7951 Long term (current) use of inhaled steroids: Secondary | ICD-10-CM | POA: Diagnosis not present

## 2020-06-29 DIAGNOSIS — Z87891 Personal history of nicotine dependence: Secondary | ICD-10-CM | POA: Diagnosis not present

## 2020-06-29 DIAGNOSIS — I10 Essential (primary) hypertension: Secondary | ICD-10-CM | POA: Insufficient documentation

## 2020-06-29 DIAGNOSIS — Z419 Encounter for procedure for purposes other than remedying health state, unspecified: Secondary | ICD-10-CM

## 2020-06-29 DIAGNOSIS — Z7984 Long term (current) use of oral hypoglycemic drugs: Secondary | ICD-10-CM | POA: Insufficient documentation

## 2020-06-29 DIAGNOSIS — X58XXXA Exposure to other specified factors, initial encounter: Secondary | ICD-10-CM | POA: Insufficient documentation

## 2020-06-29 HISTORY — PX: SHOULDER ARTHROSCOPY WITH OPEN ROTATOR CUFF REPAIR: SHX6092

## 2020-06-29 LAB — GLUCOSE, CAPILLARY
Glucose-Capillary: 94 mg/dL (ref 70–99)
Glucose-Capillary: 121 mg/dL — ABNORMAL HIGH (ref 70–99)

## 2020-06-29 SURGERY — ARTHROSCOPY, SHOULDER WITH REPAIR, ROTATOR CUFF, OPEN
Anesthesia: General | Site: Shoulder | Laterality: Right

## 2020-06-29 MED ORDER — BUPIVACAINE LIPOSOME 1.3 % IJ SUSP
INTRAMUSCULAR | Status: AC
  Filled 2020-06-29: qty 20

## 2020-06-29 MED ORDER — CHLORHEXIDINE GLUCONATE 0.12 % MT SOLN: 15.0000 mL | Freq: Once | OROMUCOSAL | Status: AC

## 2020-06-29 MED ORDER — DEXAMETHASONE SODIUM PHOSPHATE 10 MG/ML IJ SOLN
INTRAMUSCULAR | Status: DC | PRN
  Administered 2020-06-29: 8 mg via INTRAVENOUS

## 2020-06-29 MED ORDER — LIDOCAINE HCL (CARDIAC) PF 100 MG/5ML IV SOSY
PREFILLED_SYRINGE | INTRAVENOUS | Status: DC | PRN
  Administered 2020-06-29: 100 mg via INTRAVENOUS

## 2020-06-29 MED ORDER — MIDAZOLAM HCL (PF) 5 MG/ML IJ SOLN
INTRAMUSCULAR | Status: AC
  Filled 2020-06-29: qty 1

## 2020-06-29 MED ORDER — ROCURONIUM BROMIDE 100 MG/10ML IV SOLN
INTRAVENOUS | Status: DC | PRN
  Administered 2020-06-29: 50 mg via INTRAVENOUS

## 2020-06-29 MED ORDER — OXYCODONE HCL 5 MG PO TABS: 5.0000 mg | ORAL_TABLET | ORAL | 0 refills | Status: DC | PRN

## 2020-06-29 MED ORDER — PROPOFOL 10 MG/ML IV BOLUS
INTRAVENOUS | Status: DC | PRN
  Administered 2020-06-29: 160 mg via INTRAVENOUS
  Administered 2020-06-29: 30 mg via INTRAVENOUS

## 2020-06-29 MED ORDER — ONDANSETRON HCL 4 MG/2ML IJ SOLN: 4.0000 mg | Freq: Four times a day (QID) | INTRAMUSCULAR | Status: DC | PRN

## 2020-06-29 MED ORDER — ATENOLOL 50 MG PO TABS
50.0000 mg | ORAL_TABLET | Freq: Once | ORAL | Status: DC
  Filled 2020-06-29 (×2): qty 1

## 2020-06-29 MED ORDER — EPINEPHRINE PF 1 MG/ML IJ SOLN
INTRAMUSCULAR | Status: AC
  Filled 2020-06-29: qty 1

## 2020-06-29 MED ORDER — MIDAZOLAM HCL 2 MG/2ML IJ SOLN
INTRAMUSCULAR | Status: DC | PRN
  Administered 2020-06-29: 2 mg via INTRAVENOUS

## 2020-06-29 MED ORDER — ONDANSETRON HCL 4 MG/2ML IJ SOLN
INTRAMUSCULAR | Status: AC
  Filled 2020-06-29: qty 2

## 2020-06-29 MED ORDER — ONDANSETRON HCL 4 MG PO TABS: 4.0000 mg | ORAL_TABLET | Freq: Four times a day (QID) | ORAL | Status: DC | PRN

## 2020-06-29 MED ORDER — METOCLOPRAMIDE HCL 5 MG/ML IJ SOLN
5.0000 mg | Freq: Three times a day (TID) | INTRAMUSCULAR | Status: DC | PRN
  Administered 2020-06-29: 10 mg via INTRAVENOUS

## 2020-06-29 MED ORDER — FENTANYL CITRATE (PF) 100 MCG/2ML IJ SOLN
INTRAMUSCULAR | Status: DC | PRN
  Administered 2020-06-29 (×2): 50 ug via INTRAVENOUS

## 2020-06-29 MED ORDER — ONDANSETRON HCL 4 MG/2ML IJ SOLN
INTRAMUSCULAR | Status: DC | PRN
  Administered 2020-06-29: 4 mg via INTRAVENOUS

## 2020-06-29 MED ORDER — FENTANYL CITRATE (PF) 100 MCG/2ML IJ SOLN
INTRAMUSCULAR | Status: AC
  Administered 2020-06-29: 50 ug via INTRAVENOUS
  Filled 2020-06-29: qty 2

## 2020-06-29 MED ORDER — BUPIVACAINE HCL (PF) 0.5 % IJ SOLN
INTRAMUSCULAR | Status: DC | PRN
  Administered 2020-06-29: 10 mL via PERINEURAL

## 2020-06-29 MED ORDER — OXYCODONE HCL 5 MG PO TABS
5.0000 mg | ORAL_TABLET | ORAL | Status: DC | PRN
  Filled 2020-06-29: qty 2

## 2020-06-29 MED ORDER — LIDOCAINE HCL (PF) 1 % IJ SOLN
INTRAMUSCULAR | Status: DC | PRN
  Administered 2020-06-29: 3 mL

## 2020-06-29 MED ORDER — EPHEDRINE SULFATE 50 MG/ML IJ SOLN
INTRAMUSCULAR | Status: DC | PRN
  Administered 2020-06-29: 10 mg via INTRAVENOUS

## 2020-06-29 MED ORDER — MIDAZOLAM HCL 2 MG/2ML IJ SOLN
INTRAMUSCULAR | Status: AC
  Filled 2020-06-29: qty 2

## 2020-06-29 MED ORDER — MIDAZOLAM HCL 2 MG/2ML IJ SOLN: 1.0000 mg | Freq: Once | INTRAMUSCULAR | Status: DC

## 2020-06-29 MED ORDER — FENTANYL CITRATE (PF) 100 MCG/2ML IJ SOLN: 50.0000 ug | Freq: Once | INTRAMUSCULAR | Status: AC

## 2020-06-29 MED ORDER — METOCLOPRAMIDE HCL 5 MG/ML IJ SOLN
INTRAMUSCULAR | Status: AC
  Filled 2020-06-29: qty 2

## 2020-06-29 MED ORDER — BUPIVACAINE-EPINEPHRINE (PF) 0.5% -1:200000 IJ SOLN
INTRAMUSCULAR | Status: AC
  Filled 2020-06-29: qty 30

## 2020-06-29 MED ORDER — ORAL CARE MOUTH RINSE: 15.0000 mL | Freq: Once | OROMUCOSAL | Status: AC

## 2020-06-29 MED ORDER — METOCLOPRAMIDE HCL 10 MG PO TABS: 5.0000 mg | ORAL_TABLET | Freq: Three times a day (TID) | ORAL | Status: DC | PRN

## 2020-06-29 MED ORDER — SODIUM CHLORIDE 0.9 % IV SOLN: INTRAVENOUS | Status: DC

## 2020-06-29 MED ORDER — SUGAMMADEX SODIUM 200 MG/2ML IV SOLN
INTRAVENOUS | Status: DC | PRN
  Administered 2020-06-29: 200 mg via INTRAVENOUS
  Administered 2020-06-29: 100 mg via INTRAVENOUS

## 2020-06-29 MED ORDER — EPHEDRINE 5 MG/ML INJ
INTRAVENOUS | Status: AC
  Filled 2020-06-29: qty 10

## 2020-06-29 MED ORDER — FENTANYL CITRATE (PF) 100 MCG/2ML IJ SOLN
INTRAMUSCULAR | Status: AC
  Filled 2020-06-29: qty 2

## 2020-06-29 MED ORDER — FENTANYL CITRATE (PF) 100 MCG/2ML IJ SOLN: 25.0000 ug | INTRAMUSCULAR | Status: DC | PRN

## 2020-06-29 MED ORDER — BUPIVACAINE HCL (PF) 0.5 % IJ SOLN
INTRAMUSCULAR | Status: AC
  Filled 2020-06-29: qty 10

## 2020-06-29 MED ORDER — LIDOCAINE HCL (PF) 1 % IJ SOLN
INTRAMUSCULAR | Status: AC
  Filled 2020-06-29: qty 5

## 2020-06-29 MED ORDER — POTASSIUM CHLORIDE IN NACL 20-0.9 MEQ/L-% IV SOLN: INTRAVENOUS | Status: DC

## 2020-06-29 MED ORDER — BUPIVACAINE LIPOSOME 1.3 % IJ SUSP
INTRAMUSCULAR | Status: DC | PRN
  Administered 2020-06-29: 20 mL via PERINEURAL

## 2020-06-29 MED ORDER — CEFAZOLIN SODIUM-DEXTROSE 2-4 GM/100ML-% IV SOLN
2.0000 g | INTRAVENOUS | Status: AC
  Administered 2020-06-29: 2 g via INTRAVENOUS

## 2020-06-29 MED ORDER — CHLORHEXIDINE GLUCONATE 0.12 % MT SOLN
OROMUCOSAL | Status: AC
  Administered 2020-06-29: 15 mL via OROMUCOSAL
  Filled 2020-06-29: qty 15

## 2020-06-29 MED ORDER — CEFAZOLIN SODIUM-DEXTROSE 2-4 GM/100ML-% IV SOLN
INTRAVENOUS | Status: AC
  Filled 2020-06-29: qty 100

## 2020-06-29 MED ORDER — BUPIVACAINE-EPINEPHRINE 0.5% -1:200000 IJ SOLN
INTRAMUSCULAR | Status: DC | PRN
  Administered 2020-06-29: 49 mL

## 2020-06-29 MED ORDER — LACTATED RINGERS IV SOLN
INTRAVENOUS | Status: DC | PRN
  Administered 2020-06-29: 1 mL

## 2020-06-29 MED ORDER — ONDANSETRON HCL 4 MG/2ML IJ SOLN
4.0000 mg | Freq: Once | INTRAMUSCULAR | Status: AC | PRN
  Administered 2020-06-29: 4 mg via INTRAVENOUS

## 2020-06-29 SURGICAL SUPPLY — 58 items
ANCH SUT 5.5 KNTLS (Anchor) ×2 IMPLANT
ANCH SUT BN ASCP DLV (Anchor) ×1 IMPLANT
ANCH SUT Q-FX 2.8 (Anchor) ×3 IMPLANT
ANCH SUT RGNRT REGENETEN (Staple) ×1 IMPLANT
ANCHOR ALL-SUT Q-FIX 2.8 (Anchor) ×9 IMPLANT
ANCHOR BONE REGENETEN (Anchor) ×3 IMPLANT
ANCHOR HEALICOIL REGEN 5.5 (Anchor) ×6 IMPLANT
ANCHOR TENDON REGENETEN (Staple) ×3 IMPLANT
APL PRP STRL LF DISP 70% ISPRP (MISCELLANEOUS) ×1
BIT DRILL JUGRKNT W/NDL BIT2.9 (DRILL) IMPLANT
BLADE FULL RADIUS 3.5 (BLADE) ×3 IMPLANT
BUR ACROMIONIZER 4.0 (BURR) ×3 IMPLANT
CANNULA SHAVER 8MMX76MM (CANNULA) ×3 IMPLANT
CHLORAPREP W/TINT 26 (MISCELLANEOUS) ×3 IMPLANT
COVER MAYO STAND REUSABLE (DRAPES) ×3 IMPLANT
COVER WAND RF STERILE (DRAPES) ×3 IMPLANT
DILATOR 5.5 THREADED HEALICOIL (MISCELLANEOUS) ×3 IMPLANT
DRAPE IMP U-DRAPE 54X76 (DRAPES) ×6 IMPLANT
DRILL JUGGERKNOT W/NDL BIT 2.9 (DRILL)
ELECT CAUTERY BLADE TIP 2.5 (TIP) ×3
ELECT REM PT RETURN 9FT ADLT (ELECTROSURGICAL) ×3
ELECTRODE CAUTERY BLDE TIP 2.5 (TIP) ×1 IMPLANT
ELECTRODE REM PT RTRN 9FT ADLT (ELECTROSURGICAL) ×1 IMPLANT
GAUZE SPONGE 4X4 12PLY STRL (GAUZE/BANDAGES/DRESSINGS) ×3 IMPLANT
GAUZE XEROFORM 1X8 LF (GAUZE/BANDAGES/DRESSINGS) ×3 IMPLANT
GLOVE BIO SURGEON STRL SZ7.5 (GLOVE) ×6 IMPLANT
GLOVE BIO SURGEON STRL SZ8 (GLOVE) ×9 IMPLANT
GLOVE BIOGEL PI IND STRL 8 (GLOVE) ×1 IMPLANT
GLOVE BIOGEL PI INDICATOR 8 (GLOVE) ×2
GLOVE INDICATOR 8.0 STRL GRN (GLOVE) ×3 IMPLANT
GOWN STRL REUS W/ TWL LRG LVL3 (GOWN DISPOSABLE) ×1 IMPLANT
GOWN STRL REUS W/ TWL XL LVL3 (GOWN DISPOSABLE) ×1 IMPLANT
GOWN STRL REUS W/TWL LRG LVL3 (GOWN DISPOSABLE) ×3
GOWN STRL REUS W/TWL XL LVL3 (GOWN DISPOSABLE) ×3
GRASPER SUT 15 45D LOW PRO (SUTURE) IMPLANT
IMPL REGENETEN MEDIUM (Shoulder) ×1 IMPLANT
IMPLANT REGENETEN MEDIUM (Shoulder) ×3 IMPLANT
IV LACTATED RINGER IRRG 3000ML (IV SOLUTION) ×3
IV LR IRRIG 3000ML ARTHROMATIC (IV SOLUTION) ×1 IMPLANT
KIT CANNULA 8X76-LX IN CANNULA (CANNULA) IMPLANT
KIT SUTURE 2.8 Q-FIX DISP (MISCELLANEOUS) ×3 IMPLANT
MANIFOLD NEPTUNE II (INSTRUMENTS) ×3 IMPLANT
MASK FACE SPIDER DISP (MASK) ×3 IMPLANT
MAT ABSORB  FLUID 56X50 GRAY (MISCELLANEOUS) ×2
MAT ABSORB FLUID 56X50 GRAY (MISCELLANEOUS) ×1 IMPLANT
PACK SHDR ARTHRO (MISCELLANEOUS) ×3 IMPLANT
PASSER SUT FIRSTPASS SELF (INSTRUMENTS) ×3 IMPLANT
SLING ARM LRG DEEP (SOFTGOODS) IMPLANT
SLING ULTRA II LG (MISCELLANEOUS) IMPLANT
STAPLER SKIN PROX 35W (STAPLE) ×3 IMPLANT
STRAP SAFETY 5IN WIDE (MISCELLANEOUS) IMPLANT
SUT ETHIBOND 0 MO6 C/R (SUTURE) ×3 IMPLANT
SUT ULTRABRAID 2 COBRAID 38 (SUTURE) ×6 IMPLANT
SUT VIC AB 2-0 CT1 27 (SUTURE) ×6
SUT VIC AB 2-0 CT1 TAPERPNT 27 (SUTURE) ×2 IMPLANT
TAPE MICROFOAM 4IN (TAPE) ×3 IMPLANT
TUBING ARTHRO INFLOW-ONLY STRL (TUBING) ×3 IMPLANT
WAND WEREWOLF FLOW 90D (MISCELLANEOUS) ×3 IMPLANT

## 2020-06-29 NOTE — Anesthesia Procedure Notes (Signed)
Procedure Name: Intubation Date/Time: 06/29/2020 2:17 PM Performed by: Allean Found, CRNA Pre-anesthesia Checklist: Patient identified, Patient being monitored, Timeout performed, Emergency Drugs available and Suction available Patient Re-evaluated:Patient Re-evaluated prior to induction Oxygen Delivery Method: Circle system utilized Preoxygenation: Pre-oxygenation with 100% oxygen Induction Type: IV induction Ventilation: Mask ventilation without difficulty Laryngoscope Size: Mac and 3 Grade View: Grade I Tube type: Oral Tube size: 7.0 mm Number of attempts: 1 Airway Equipment and Method: Stylet Placement Confirmation: ETT inserted through vocal cords under direct vision,  positive ETCO2 and breath sounds checked- equal and bilateral Secured at: 22 cm Tube secured with: Tape Dental Injury: Teeth and Oropharynx as per pre-operative assessment

## 2020-06-29 NOTE — Anesthesia Preprocedure Evaluation (Addendum)
Anesthesia Evaluation  Patient identified by MRN, date of birth, ID band Patient awake    Reviewed: Allergy & Precautions, NPO status , Patient's Chart, lab work & pertinent test results  History of Anesthesia Complications Negative for: history of anesthetic complications  Airway Mallampati: II  TM Distance: >3 FB Neck ROM: Full    Dental  (+) Missing   Pulmonary asthma (uses daily controller) , neg sleep apnea, former smoker,    breath sounds clear to auscultation- rhonchi (-) wheezing      Cardiovascular hypertension, Pt. on medications (-) angina+ CAD  (-) Cardiac Stents and (-) CABG  Rhythm:Regular Rate:Normal - Systolic murmurs and - Diastolic murmurs NM stress test 05/08/19: Negative ETT with good exercise tolerance. LV function normal. No  evidence of ischemia.     Neuro/Psych neg Seizures PSYCHIATRIC DISORDERS Anxiety    GI/Hepatic Neg liver ROS, GERD  ,  Endo/Other  diabetes, Oral Hypoglycemic Agents  Renal/GU negative Renal ROS     Musculoskeletal  (+) Arthritis ,   Abdominal (+) + obese,   Peds  Hematology  (+) anemia ,   Anesthesia Other Findings Past Medical History: No date: Anemia     Comment:  h/o No date: Anxiety No date: Arthritis     Comment:  right hip No date: Asthma     Comment:  well controlled No date: Cellulitis No date: Coronary artery disease No date: Diabetes mellitus without complication (HCC) No date: GERD (gastroesophageal reflux disease) No date: Helicobacter pylori (H. pylori) infection No date: Hypercholesteremia No date: Hypertension   Reproductive/Obstetrics                            Anesthesia Physical Anesthesia Plan  ASA: III  Anesthesia Plan: General   Post-op Pain Management:  Regional for Post-op pain   Induction: Intravenous  PONV Risk Score and Plan: 2 and Ondansetron, Dexamethasone and Midazolam  Airway Management  Planned: Oral ETT  Additional Equipment:   Intra-op Plan:   Post-operative Plan: Extubation in OR  Informed Consent: I have reviewed the patients History and Physical, chart, labs and discussed the procedure including the risks, benefits and alternatives for the proposed anesthesia with the patient or authorized representative who has indicated his/her understanding and acceptance.     Dental advisory given  Plan Discussed with: CRNA and Anesthesiologist  Anesthesia Plan Comments:         Anesthesia Quick Evaluation

## 2020-06-29 NOTE — Transfer of Care (Signed)
Immediate Anesthesia Transfer of Care Note  Patient: Vanessa Hampton  Procedure(s) Performed: Right shoulder arthroscopy with debridement, decompression, repair of a massive rotator cuff tear, and biceps tenodesis. (Right Shoulder)  Patient Location: PACU  Anesthesia Type:General  Level of Consciousness: sedated  Airway & Oxygen Therapy: Patient Spontanous Breathing and Patient connected to face mask oxygen  Post-op Assessment: Report given to RN and Post -op Vital signs reviewed and stable  Post vital signs: Reviewed and stable  Last Vitals:  Vitals Value Taken Time  BP 135/78 06/29/20 1644  Temp 36 C 06/29/20 1644  Pulse 71 06/29/20 1649  Resp 19 06/29/20 1649  SpO2 100 % 06/29/20 1649  Vitals shown include unvalidated device data.  Last Pain:  Vitals:   06/29/20 1644  TempSrc:   PainSc: (P) Asleep      Patients Stated Pain Goal: 0 (06/29/20 1133)  Complications: No complications documented.

## 2020-06-29 NOTE — Op Note (Addendum)
06/29/2020  4:42 PM  Patient:   Vanessa Hampton  Pre-Op Diagnosis:   Impingement/tendinopathy with massive rotator cuff tear and biceps tendinopathy, right shoulder.  Post-Op Diagnosis:   Impingement/tendinopathy with massive rotator cuff tear, degenerative labral fraying, and biceps tendinopathy, right shoulder.  Procedure:   Extensive arthroscopic debridement, arthroscopic subacromial decompression, mini-open rotator cuff repair supplemented with a Smith & Nephew Regeneten patch, and biceps tenolysis, right shoulder.  Anesthesia:   General endotracheal with interscalene block using Exparel placed preoperatively by the anesthesiologist.  Surgeon:   Maryagnes Amos, MD  Assistant:   Horris Latino, PA-C; Volanda Napoleon, PA-S  Findings:   As above. There was moderate fraying of the labrum anteriorly and superiorly without frank detachment from the glenoid. There was a large rotator cuff tear involving the entire insertion of the supraspinatus infraspinatus tendons with retraction back to the glenohumeral joint measuring approximately 3 x 4 cm. The subscapularis and teres minor tendons were in satisfactory condition. There was extensive partial thickness tearing of the biceps tendon. The articular surfaces of the glenoid and humerus both were in satisfactory condition.  Complications:   None  Fluids:   800 cc  Estimated blood loss:   10 cc  Tourniquet time:   None  Drains:   None  Closure:   Staples      Brief clinical note:   The patient is a 64 year old female with a long history of gradually worsening right shoulder pain. Her symptoms have progressed despite medications, activity modification, etc. The patient's history and examination are consistent with impingement/tendinopathy with a rotator cuff tear. These findings were confirmed by MRI scan. The patient presents at this time for definitive management of these shoulder symptoms.  Procedure:   The patient underwent  placement of an interscalene block using Exparel by the anesthesiologist in the preoperative holding area before being brought into the operating room and lain in the supine position. The patient then underwent general endotracheal intubation and anesthesia before being repositioned in the beach chair position using the beach chair positioner. The right shoulder and upper extremity were prepped with ChloraPrep solution before being draped sterilely. Preoperative antibiotics were administered. A timeout was performed to confirm the proper surgical site before the expected portal sites and incision site were injected with 0.5% Sensorcaine with epinephrine. A posterior portal was created and the glenohumeral joint thoroughly inspected with the findings as described above. An anterior portal was created using an outside-in technique. The labrum and rotator cuff were further probed, again confirming the above-noted findings. The areas of labral fraying were debrided back to stable margins using the full-radius resector, as were areas of synovitis. The torn margins of the rotator cuff also were debrided back to stable margins using the full-radius resector. The ArthroCare wand was inserted and used to release the biceps tendon from its labral anchor. It also was used to obtain hemostasis as well as to "anneal" the labrum superiorly and anteriorly. The instruments were removed from the joint after suctioning the excess fluid.  The camera was repositioned through the posterior portal into the subacromial space. A separate lateral portal was created using an outside-in technique. The 3.5 mm full-radius resector was introduced and used to perform a subtotal bursectomy. The ArthroCare wand was then inserted and used to remove the periosteal tissue off the undersurface of the anterior third of the acromion as well as to recess the coracoacromial ligament from its attachment along the anterior and lateral margins of the  acromion.  The 4.0 mm acromionizing bur was introduced and used to complete the decompression by removing the undersurface of the anterior third of the acromion. The full radius resector was reintroduced to remove any residual bony debris before the ArthroCare wand was reintroduced to obtain hemostasis. The instruments were then removed from the subacromial space after suctioning the excess fluid.  An approximately 4-5 cm incision was made over the anterolateral aspect of the shoulder beginning at the anterolateral corner of the acromion and extending distally in line with the bicipital groove. This incision was carried down through the subcutaneous tissues to expose the deltoid fascia. The raphae between the anterior and middle thirds was identified and this plane developed to provide access into the subacromial space. Additional bursal tissues were debrided sharply using Metzenbaum scissors. The rotator cuff tear was readily identified. The margins were debrided sharply with a #15 blade and the exposed greater tuberosity roughened with a rongeur. The tear was repaired using three Smith & Nephew 2.8 mm Q-Fix anchors. These sutures were then brought back laterally and secured using two Smith & Nephew Healicoil knotless Regenesorb anchors to create a two-layer closure. Given the size of the tear and the quality of the tissue, it is felt best to apply a Smith & Nephew Regeneten patch to optimize healing. Therefore, a medium-sized patch was selected and secured using the appropriate bone and soft tissue staples. An apparent watertight closure was obtained.  The wound was copiously irrigated with sterile saline solution before the deltoid raphae was reapproximated using 2-0 Vicryl interrupted sutures. The subcutaneous tissues were closed in two layers using 2-0 Vicryl interrupted sutures before the skin was closed using staples. The portal sites also were closed using staples. A sterile bulky dressing was applied to  the shoulder before the arm was placed into a shoulder immobilizer. The patient was then awakened, extubated, and returned to the recovery room in satisfactory condition after tolerating the procedure well.

## 2020-06-29 NOTE — H&P (Signed)
History of Present Illness: Vanessa Hampton is a 64 y.o. female who presents today for her surgical history and physical for upcoming right shoulder arthroscopy with debridement, decompression and repair of a massive rotator cuff tear in addition to a biceps tenodesis/tenolysis. The patient is scheduled with Dr. Joice Lofts on 06/29/2020. The patient denies any changes in her medical history since she was last evaluated. She reports a 0 out of 10 pain score at today's visit. She denies any numbness or tingling to the right upper extremity. Patient denies any personal history of heart attack, stroke or DVT. The patient does have a history of asthma for which she takes a daily inhaler in addition to a rescue inhaler. The patient does report that her husband passed away recently of a heart attack on Father's Day. She states that she will be staying with her daughter-in-law after surgery and does still feel that she is capable of undergoing the surgery at this time.  Past Medical History: . Anxiety  . Depression  . DIABETES MELLITUS  2011?  . GERD (gastroesophageal reflux disease) 2016  . Hyperlipidemia  2016  . Hypertension  2006  . Obesity  . Panic attacks   Past Surgical History: . APPENDECTOMY 2005  . CHOLECYSTECTOMY 2008?  stones  . COLONOSCOPY 2008  . ESOPHAGOGASTRODOUDENOSCOPY W/BIOPSY 10/08/2015  Procedure: ESOPHAGOGASTRODUODENOSCOPY, FLEXIBLE, TRANSORAL; WITH BIOPSY, SINGLE OR MULTIPLE; Surgeon: Daralene Milch., MD; Location: [facility]; Service: Gastroenterology;;  . HYSTERECTOMY 1998  anemia with fibroids   Past Family History: . Anxiety Mother  . Depression Mother  . High blood pressure (Hypertension) Mother  . Stroke Mother  . Lung cancer Mother  . Hyperlipidemia (Elevated cholesterol) Mother  . Alcohol abuse Father  . Liver cancer Father  . High blood pressure (Hypertension) Father  . Obesity Father  . Hyperlipidemia (Elevated cholesterol) Father  . Anxiety  Sister  . Depression Sister  . High blood pressure (Hypertension) Sister  . Obesity Sister  . Hyperlipidemia (Elevated cholesterol) Sister  . Myocardial Infarction (Heart attack) Brother  . Prostate cancer Brother  . High blood pressure (Hypertension) Brother  . Prostate cancer Brother  . Hyperlipidemia (Elevated cholesterol) Brother  . High blood pressure (Hypertension) Brother  . Hyperlipidemia (Elevated cholesterol) Brother  . High blood pressure (Hypertension) Brother  . Coronary Artery Disease (Blocked arteries around heart) Brother 58  . Hyperlipidemia (Elevated cholesterol) Brother  . High blood pressure (Hypertension) Brother  . Prostate cancer Brother  . Hyperlipidemia (Elevated cholesterol) Brother  . High blood pressure (Hypertension) Brother  . Hyperlipidemia (Elevated cholesterol) Brother  . High blood pressure (Hypertension) Sister  . Hyperlipidemia (Elevated cholesterol) Sister  . High blood pressure (Hypertension) Sister  . Obesity Sister  . Diabetes type II Sister  . Hyperlipidemia (Elevated cholesterol) Sister  . High blood pressure (Hypertension) Brother  . Hyperlipidemia (Elevated cholesterol) Brother  . Thyroid disease Neg Hx  . Breast cancer Neg Hx  . Colon cancer Neg Hx  . Colon polyps Neg Hx   Medications: . amoxicillin (AMOXIL) 875 MG tablet Take by mouth Take 1 tablet (875 mg total) by mouth 2 (two) times daily.  Marland Kitchen atenoloL-chlorthalidone (TENORETIC) 50-25 mg tablet Take 1 tablet by mouth once daily 90 tablet 3  . diphenhydrAMINE (BENADRYL) 25 mg capsule Take 25 mg by mouth every 6 (six) hours as needed for Itching  . escitalopram oxalate (LEXAPRO) 20 MG tablet Take 1 tablet (20 mg total) by mouth once daily for 90 days 90 tablet 3  .  fluticasone furoate-vilanterol (BREO ELLIPTA) 200-25 mcg/dose DsDv Inhale 1 inhalation into the lungs once daily 28 each 11  . ibuprofen (MOTRIN) 200 MG tablet Take by mouth Take 600 mg by mouth every 6 (six) hours as  needed  . ketoconazole (NIZORAL) 2 % cream Apply topically once daily 30 g 2  . metFORMIN (GLUCOPHAGE) 500 MG tablet Take 1 tablet (500 mg total) by mouth 2 (two) times daily with meals 180 tablet 3  . omeprazole (PRILOSEC) 40 MG DR capsule Take 1 capsule (40 mg total) by mouth 2 (two) times daily 180 capsule 3  . rosuvastatin (CRESTOR) 40 MG tablet Take 1 tablet (40 mg total) by mouth once daily 90 tablet 3  . semaglutide (OZEMPIC) 0.25 mg or 0.5 mg(2 mg/1.5 mL) pen injector Inject 0.1875 mLs (0.25 mg total) subcutaneously once a week for 30 days 0.75 mL 0  . albuterol 90 mcg/actuation inhaler Inhale 2 inhalations into the lungs every 6 (six) hours as needed for Wheezing 1 Inhaler 2   No current Epic-ordered facility-administered medications on file.   Allergies: . Lisinopril Other (See Comments)  Severe coughing  . Ace Inhibitors Cough  . Aspirin Hives  . Others Hives and Swelling  Uncoded Allergy. Allergen: seafood  . Shellfish Containing Products Hives   Review of Systems:  A comprehensive 14 point ROS was performed, reviewed by me today, and the pertinent orthopaedic findings are documented in the HPI.  Physical Exam: BP 122/82  Ht 160 cm (5\' 3" )  Wt (!) 107.5 kg (237 lb)  BMI 41.98 kg/m  General/Constitutional: The patient appears to be well-nourished, well-developed, and in no acute distress. Neuro/Psych: Normal mood and affect, oriented to person, place and time. Eyes: Non-icteric. Pupils are equal, round, and reactive to light, and exhibit synchronous movement. ENT: Unremarkable. Lymphatic: No palpable adenopathy. Respiratory: Lungs clear to auscultation, Normal chest excursion, No wheezes and Non-labored breathing Cardiovascular: Regular rate and rhythm. No murmurs. and No edema, swelling or tenderness, except as noted in detailed exam. Integumentary: No impressive skin lesions present, except as noted in detailed exam. Musculoskeletal: Unremarkable, except as noted in  detailed exam.  Right shoulder exam: SKIN: normal SWELLING: none WARMTH: none LYMPH NODES: no adenopathy palpable CREPITUS: none TENDERNESS: Mildly tender over anterolateral shoulder ROM (active):  Forward flexion: 90 degrees Abduction: 80 degrees Internal rotation: Right buttock ROM (passive):  Forward flexion: 160 degrees Abduction: 150 degrees  ER/IR at 90 abd: 85 degrees / 50 degrees  She has moderate pain with forward flexion and abduction, and mild pain with all other motions.  STRENGTH: Forward flexion: 3/5 Abduction: 3/5 External rotation: 4/5 Internal rotation: 4-4+/5 Pain with RC testing: Moderate pain with resisted forward flexion and abduction  STABILITY: Normal  SPECIAL TESTS: ' test: positive, moderate Speed's test: positive Capsulitis - pain w/ passive ER: no Crossed arm test: Mildly positive Crank: Not evaluated Anterior apprehension: Negative Posterior apprehension: Not evaluated  She is neurovascularly intact to the right upper extremity.  Imaging:  Right Shoulder MRI: MRI Shoulder Cartilage: Partial thickness humeral head cartilage loss. Partial thickness glenoid cartilage loss. MRI Shoulder Rotator Cuff: Full thickness tear of the supraspinatus. Retracted to the glenohumeral joint. Significant tendinopathy of infraspinatus tendon with moderate to severe atrophy/fatty replacement of the supraspinatus and superior portion of infraspinatus muscles. MRI Shoulder Labrum / Biceps: Biceps tendinopathy. MRI Shoulder Bone: Normal bone.  Impression: 1. Traumatic complete tear of right rotator cuff. 2. Tendinitis of upper biceps tendon of right shoulder   Plan:  1. Treatment options were discussed today with the patient. 2. The patient is scheduled for a right shoulder arthroscopy with debridement, decompression, repair of massive rotator cuff tear in addition to a biceps tenodesis/tenolysis. The patient is scheduled with Dr. Joice Lofts on  06/29/2020. 3. The risk and benefits of the procedure were discussed in detail with the patient at today's visit. The patient verbalizes her understanding and wishes to proceed. 4. This document will serve as a surgical history and physical for the patient. The patient was offered and received a slingshot shoulder sling to wear following surgery. 5. The patient will follow-up per standard postop protocol. They can call the clinic they have any questions, new symptoms develop or symptoms worsen.  The procedure was discussed with the patient, as were the potential risks (including bleeding, infection, nerve and/or blood vessel injury, persistent or recurrent pain, failure of the repair, progression of arthritis, need for further surgery, blood clots, strokes, heart attacks and/or arhythmias, pneumonia, etc.) and benefits. The patient states her understanding and wishes to proceed.   H&P reviewed and patient re-examined. No changes.

## 2020-06-29 NOTE — Anesthesia Procedure Notes (Signed)
Anesthesia Regional Block: Interscalene brachial plexus block   Pre-Anesthetic Checklist: ,, timeout performed, Correct Patient, Correct Site, Correct Laterality, Correct Procedure, Correct Position, site marked, Risks and benefits discussed,  Surgical consent,  Pre-op evaluation,  At surgeon's request and post-op pain management  Laterality: Right  Prep: chloraprep       Needles:  Injection technique: Single-shot  Needle Type: Stimiplex     Needle Length: 10cm  Needle Gauge: 21     Additional Needles:   Procedures:,,,, ultrasound used (permanent image in chart),,,,  Narrative:  Start time: 06/29/2020 12:55 PM End time: 06/29/2020 12:59 PM Injection made incrementally with aspirations every 5 mL.  Performed by: Personally  Anesthesiologist: Alver Fisher, MD  Additional Notes: Functioning IV was confirmed and monitors were applied.  A Stimuplex needle was used. Sterile prep and drape,hand hygiene and sterile gloves were used.  Negative aspiration and negative test dose prior to incremental administration of local anesthetic. The patient tolerated the procedure well.

## 2020-06-29 NOTE — Discharge Instructions (Addendum)
Orthopedic discharge instructions: Keep dressing dry and intact.  May shower after dressing changed on post-op day #4 (Saturday).  Cover staples with Band-Aids after drying off. Apply ice frequently to shoulder. Take ibuprofen 600-800 mg TID with meals for 7-10 days, then as necessary.      TID = three times per day (every 8 hours) Take oxycodone as prescribed when needed.  May supplement with ES Tylenol if necessary. Keep shoulder immobilizer on at all times except may remove for bathing purposes. Follow-up in 10-14 days or as scheduled.     Interscalene Nerve Block with Exparel  1.  For your surgery you have received an Interscalene Nerve Block with Exparel. 2. Nerve Blocks affect many types of nerves, including nerves that control movement, pain and normal sensation.  You may experience feelings such as numbness, tingling, heaviness, weakness or the inability to move your arm or the feeling or sensation that your arm has "fallen asleep". 3. A nerve block with Exparel can last up to 5 days.  Usually the weakness wears off first.  The tingling and heaviness usually wear off next.  Finally you may start to notice pain.  Keep in mind that this may occur in any order.  Once a nerve block starts to wear off it is usually completely gone within 60 minutes. 4. ISNB may cause mild shortness of breath, a hoarse voice, blurry vision, unequal pupils, or drooping of the face on the same side as the nerve block.  These symptoms will usually resolve with the numbness.  Very rarely the procedure itself can cause mild seizures. 5. If needed, your surgeon will give you a prescription for pain medication.  It will take about 60 minutes for the oral pain medication to become fully effective.  So, it is recommended that you start taking this medication before the nerve block first begins to wear off, or when you first begin to feel discomfort. 6. Take your pain medication only as prescribed.  Pain medication can  cause sedation and decrease your breathing if you take more than you need for the level of pain that you have. 7. Nausea is a common side effect of many pain medications.  You may want to eat something before taking your pain medicine to prevent nausea. 8. After an Interscalene nerve block, you cannot feel pain, pressure or extremes in temperature in the effected arm.  Because your arm is numb it is at an increased risk for injury.  To decrease the possibility of injury, please practice the following:  a. While you are awake change the position of your arm frequently to prevent too much pressure on any one area for prolonged periods of time. b.  If you have a cast or tight dressing, check the color or your fingers every couple of hours.  Call your surgeon with the appearance of any discoloration (white or blue). c. If you are given a sling to wear before you go home, please wear it  at all times until the block has completely worn off.  Do not get up at night without your sling. d. Please contact [facility] or your surgeon if you do not begin to regain sensation after 7 days from the surgery.  Anesthesia may be contacted by calling the Same Day Surgery Department, Mon. through Fri., 6 am to 4 pm at [phone].   e. If you experience any other problems or concerns, please contact your surgeon's office. f. If you experience severe or prolonged shortness   of breath go to the nearest emergency department.  AMBULATORY SURGERY  DISCHARGE INSTRUCTIONS   1) The drugs that you were given will stay in your system until tomorrow so for the next 24 hours you should not:  A) Drive an automobile B) Make any legal decisions C) Drink any alcoholic beverage   2) You may resume regular meals tomorrow.  Today it is better to start with liquids and gradually work up to solid foods.  You may eat anything you prefer, but it is better to start with liquids, then soup and crackers, and gradually work up to  solid foods.   3) Please notify your doctor immediately if you have any unusual bleeding, trouble breathing, redness and pain at the surgery site, drainage, fever, or pain not relieved by medication.    4) Additional Instructions:        Please contact your physician with any problems or Same Day Surgery at [phone], Monday through Friday 6 am to 4 pm, or [place] [facility] [place] [facility] number at [phone]. 

## 2020-06-30 ENCOUNTER — Encounter: Payer: Self-pay | Admitting: Surgery

## 2020-06-30 NOTE — Anesthesia Postprocedure Evaluation (Signed)
Anesthesia Post Note  Patient: Vanessa Hampton  Procedure(s) Performed: Right shoulder arthroscopy with debridement, decompression, repair of a massive rotator cuff tear, and biceps tenodesis. (Right Shoulder)  Patient location during evaluation: PACU Anesthesia Type: General Level of consciousness: awake and alert Pain management: pain level controlled Vital Signs Assessment: post-procedure vital signs reviewed and stable Respiratory status: spontaneous breathing, nonlabored ventilation, respiratory function stable and patient connected to nasal cannula oxygen Cardiovascular status: blood pressure returned to baseline and stable Postop Assessment: no apparent nausea or vomiting Anesthetic complications: no   No complications documented.   Last Vitals:  Vitals:   06/29/20 1729 06/29/20 1800  BP: 127/71 (!) 132/75  Pulse: 71 74  Resp: 20 16  Temp:  (!) 36.3 C  SpO2: 96% 94%    Last Pain:  Vitals:   06/29/20 1800  TempSrc: Temporal  PainSc: 0-No pain                 Corinda Gubler

## 2020-10-17 ENCOUNTER — Encounter: Payer: Self-pay | Admitting: Emergency Medicine

## 2020-10-17 ENCOUNTER — Other Ambulatory Visit: Payer: Self-pay

## 2020-10-17 ENCOUNTER — Ambulatory Visit
Admission: EM | Admit: 2020-10-17 | Discharge: 2020-10-17 | Disposition: A | Discharge status: Home / Self Care | Payer: BC Managed Care – PPO | Attending: Internal Medicine | Admitting: Internal Medicine

## 2020-10-17 DIAGNOSIS — Z888 Allergy status to other drugs, medicaments and biological substances status: Secondary | ICD-10-CM | POA: Insufficient documentation

## 2020-10-17 DIAGNOSIS — Z20822 Contact with and (suspected) exposure to covid-19: Secondary | ICD-10-CM | POA: Insufficient documentation

## 2020-10-17 DIAGNOSIS — R051 Acute cough: Secondary | ICD-10-CM | POA: Insufficient documentation

## 2020-10-17 DIAGNOSIS — Z7984 Long term (current) use of oral hypoglycemic drugs: Secondary | ICD-10-CM | POA: Insufficient documentation

## 2020-10-17 DIAGNOSIS — J069 Acute upper respiratory infection, unspecified: Secondary | ICD-10-CM | POA: Insufficient documentation

## 2020-10-17 DIAGNOSIS — Z7951 Long term (current) use of inhaled steroids: Secondary | ICD-10-CM | POA: Insufficient documentation

## 2020-10-17 DIAGNOSIS — Z79899 Other long term (current) drug therapy: Secondary | ICD-10-CM | POA: Insufficient documentation

## 2020-10-17 DIAGNOSIS — Z87891 Personal history of nicotine dependence: Secondary | ICD-10-CM | POA: Insufficient documentation

## 2020-10-17 LAB — RESPIRATORY PANEL BY RT PCR (FLU A&B, COVID)
Influenza A by PCR: NEGATIVE
Influenza B by PCR: NEGATIVE
SARS Coronavirus 2 by RT PCR: NEGATIVE

## 2020-10-17 NOTE — Discharge Instructions (Signed)
Do saline nose rinses twice a day with distilled water or cooled down boiled water for 3-5 days to clean out the post nasal drainage.  You may take Plain Robitussin at bed time for cough if needed.  Stay quarantined until the covid test is back.   To help your immune system fight this virus if your covid test is negative you may take the following supplements if you like.  Melatonin 6-10 mg at bed time which also helps support your immune system.  Also make sure to take Vit D 5,000 IU per day with a fatty meal and Vit C 5000 mg a day until you are completely   If your covid test is positive you benefit from monoclonal antibody infusion. Due to your chronic conditions and if your covid test is positive, you qualify to receive Monoclonal Antibody infusion and if you don't hear from anyone within 24 hours of finding your results, call [phone] to set up an appointment since you have 10 days from onset of symptoms to benefit from this treatment.

## 2020-10-17 NOTE — ED Triage Notes (Signed)
Pt c/o cough, post nasal drainage, and chills. Started about 3 days ago.

## 2020-10-17 NOTE — ED Provider Notes (Signed)
[facility]    CSN: [id number] Arrival date & time: 10/17/20  1102      History   Chief Complaint Chief Complaint  Patient presents with  . Cough    HPI Vanessa Hampton is a 64 y.o. female who presents with onset of cough, chills and post nasal drainage 3 days ago. Denies GI symptoms, HA or body aches. 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 Had negative covid test last week while asymptomatic.  Has had Moderna covid injections this past Spring.    Past Medical History:  Diagnosis Date  . Anemia    h/o  . Anxiety   . Arthritis    right hip  . Asthma    well controlled  . Cellulitis   . Coronary artery disease   . Diabetes mellitus without complication (HCC)   . GERD (gastroesophageal reflux disease)   . Helicobacter pylori (H. pylori) infection   . Hypercholesteremia   . Hypertension     Patient Active Problem List   Diagnosis Date Noted  . Angina pectoris (HCC)   . Diabetes type 2, uncontrolled (HCC)   . Anxiety disorder   . Hyperlipidemia   . Pain in the chest   . Chest pain 08/30/2015    Past Surgical History:  Procedure Laterality Date  . ABDOMINAL HYSTERECTOMY    . APPENDECTOMY    . BREAST CYST EXCISION Right   . BREAST CYST EXCISION Left   . CHOLECYSTECTOMY    . LIPOMA EXCISION     back of head and chest area  . SHOULDER ARTHROSCOPY WITH OPEN ROTATOR CUFF REPAIR Right 06/29/2020   Procedure: Right shoulder arthroscopy with debridement, decompression, repair of a massive rotator cuff tear, and biceps tenodesis.;  Surgeon: Christena Flake, MD;  Location: [facility];  Service: Orthopedics;  Laterality: Right;    OB History   No obstetric history on file.      Home Medications    Prior to Admission medications   Medication Sig Start Date End Date Taking? Authorizing Provider  albuterol (PROVENTIL HFA;VENTOLIN HFA) 108 (90 Base) MCG/ACT inhaler Inhale 2 puffs into the lungs every 6 (six)  hours as needed for wheezing. 12/30/16  Yes Renford Dills, NP  cetirizine (ZYRTEC) 10 MG tablet Take 1 tablet (10 mg total) by mouth daily. Patient taking differently: Take 10 mg by mouth every morning.  12/20/18  Yes Cook, Jayce G, DO  escitalopram (LEXAPRO) 20 MG tablet Take 20 mg by mouth at bedtime.  09/30/18 10/17/20 Yes [provider]  fluticasone (FLONASE) 50 MCG/ACT nasal spray Place 2 sprays into both nostrils daily. 11/29/17  Yes Cook, Jayce G, DO  fluticasone furoate-vilanterol (BREO ELLIPTA) 200-25 MCG/INH AEPB Inhale 1 puff into the lungs every morning.  02/21/17  Yes [provider]  ibuprofen (ADVIL) 200 MG tablet Take 600 mg by mouth every 6 (six) hours as needed.   Yes [provider]  metFORMIN (GLUCOPHAGE) 500 MG tablet Take 500 mg by mouth 2 (two) times daily with a meal.   Yes [provider]  olmesartan-hydrochlorothiazide (BENICAR HCT) 40-25 MG tablet Take 1 tablet by mouth daily. 09/10/20 09/10/21 Yes [provider]  omeprazole (PRILOSEC) 20 MG capsule Take 20 mg by mouth 2 (two) times daily before a meal.   Yes [provider]  Semaglutide (OZEMPIC, 0.25 OR 0.5 MG/DOSE, [place]) Inject 0.25 mg into the skin every 7 (seven) days.   Yes [provider]  simvastatin (ZOCOR) 40 MG tablet Take 40 mg by mouth at bedtime.   Yes [provider]  atenolol-chlorthalidone (TENORETIC) 50-25 MG per tablet Take 1 tablet by mouth every morning.     [provider]  Multiple Vitamins-Minerals (HAIR/SKIN/NAILS) TABS Take 2 tablets by mouth daily.    [provider]  oxyCODONE (ROXICODONE) 5 MG immediate release tablet Take 1-2 tablets (5-10 mg total) by mouth every 4 (four) hours as needed for moderate pain or severe pain. 06/29/20   Poggi, Excell Seltzer, MD    Family History Family History  Problem Relation Age of Onset  . Cancer Mother   . Cancer Father   . Breast cancer Neg Hx     Social History Social  History   Tobacco Use  . Smoking status: Former Smoker    Years: 15.00    Types: Cigarettes  . Smokeless tobacco: Never Used  Vaping Use  . Vaping Use: Never used  Substance Use Topics  . Alcohol use: No  . Drug use: No     Allergies   Shellfish allergy, Aspirin, and Lisinopril   Review of Systems Review of Systems  Constitutional: Negative for activity change, appetite change, chills, diaphoresis, fatigue and fever.  HENT: Positive for congestion, postnasal drip and rhinorrhea. Negative for sore throat and trouble swallowing.   Eyes: Negative for discharge.  Respiratory: Positive for cough. Negative for chest tightness, shortness of breath and wheezing.   Cardiovascular: Negative for chest pain.  Gastrointestinal: Negative for abdominal pain, diarrhea, nausea and vomiting.  Musculoskeletal: Negative for myalgias.  Skin: Negative for rash.     Physical Exam Triage Vital Signs ED Triage Vitals  Enc Vitals Group     BP 10/17/20 1147 (!) 162/84     Pulse Rate 10/17/20 1147 68     Resp 10/17/20 1147 18     Temp 10/17/20 1147 98.1 F (36.7 C)     Temp Source 10/17/20 1147 Oral     SpO2 10/17/20 1147 100 %     Weight 10/17/20 1144 233 lb 0.4 oz (105.7 kg)     Height 10/17/20 1144 5\' 3"  (1.6 m)     Head Circumference --      Peak Flow --      Pain Score 10/17/20 1144 0     Pain Loc --      Pain Edu? --      Excl. in GC? --    No data found.  Updated Vital Signs BP (!) 162/84 (BP Location: Right Arm)   Pulse 68   Temp 98.1 F (36.7 C) (Oral)   Resp 18   Ht 5\' 3"  (1.6 m)   Wt 233 lb 0.4 oz (105.7 kg)   SpO2 100%   BMI 41.28 kg/m   Visual Acuity Right Eye Distance:   Left Eye Distance:   Bilateral Distance:    Right Eye Near:   Left Eye Near:    Bilateral Near:     Physical Exam Physical Exam Vitals signs and nursing note reviewed.  Constitutional:      General: She is not in acute distress.    Appearance: Normal appearance. She is not  ill-appearing, toxic-appearing or diaphoretic.  HENT:     Head: Normocephalic.     Right Ear: Tympanic membrane, ear canal and external ear normal.     Left Ear: Tympanic membrane, ear canal and external ear normal.     Nose: with clear mucous  Mouth/Throat:     Mouth: Mucous membranes are moist.  Eyes:     General: No scleral icterus.       Right eye: No discharge.        Left eye: No discharge.     Conjunctiva/sclera: Conjunctivae normal.  Neck:     Musculoskeletal: Neck supple. No neck rigidity.  Cardiovascular:     Rate and Rhythm: Normal rate and regular rhythm.     Heart sounds: No murmur.  Pulmonary:     Effort: Pulmonary effort is normal.     Breath sounds: Normal breath sounds.    Musculoskeletal: Normal range of motion.  Lymphadenopathy:     Cervical: No cervical adenopathy.  Skin:    General: Skin is warm and dry.     Coloration: Skin is not jaundiced.     Findings: No rash.  Neurological:     Mental Status: She is alert and oriented to person, place, and time.     Gait: Gait normal.  Psychiatric:        Mood and Affect: Mood normal.        Behavior: Behavior normal.        Thought Content: Thought content normal.        Judgment: Judgment normal.     UC Treatments / Results  Labs (all labs ordered are listed, but only abnormal results are displayed) Labs Reviewed  RESPIRATORY PANEL BY RT PCR (FLU A&B, COVID)    EKG   Radiology No results found.  Procedures Procedures (including critical care time)  Medications Ordered in UC Medications - No data to display  Initial Impression / Assessment and Plan / UC Course  I have reviewed the triage vital signs and the nursing notes. Has viral URI. Covid test pending. Supportive care advised. See instructions.   Final Clinical Impressions(s) / UC Diagnoses   Final diagnoses:  None   Discharge Instructions   None    ED Prescriptions    None     PDMP not reviewed this encounter.     Garey Ham, PA-C 10/17/20 1211

## 2021-03-08 ENCOUNTER — Ambulatory Visit
Admission: EM | Admit: 2021-03-08 | Discharge: 2021-03-08 | Disposition: A | Discharge status: Home / Self Care | Payer: BC Managed Care – PPO | Attending: Emergency Medicine | Admitting: Emergency Medicine

## 2021-03-08 ENCOUNTER — Other Ambulatory Visit: Payer: Self-pay

## 2021-03-08 DIAGNOSIS — J309 Allergic rhinitis, unspecified: Secondary | ICD-10-CM

## 2021-03-08 MED ORDER — IPRATROPIUM BROMIDE 0.06 % NA SOLN: 2.0000 | Freq: Four times a day (QID) | NASAL | 12 refills | Status: DC

## 2021-03-08 NOTE — ED Provider Notes (Signed)
[facility]    CSN: [id number] Arrival date & time: 03/08/21  1200      History   Chief Complaint Chief Complaint  Patient presents with  . Dizziness    HPI Vanessa Hampton is a 65 y.o. female.   HPI   65 year old female here for evaluation of nasal congestion and feeling off balance.  Patient reports that she has had symptoms for the last 2 to 3 days.  She states that she had a lot of nasal congestion at the start and then yesterday she developed postnasal drip watery eyes, and a nonproductive cough.  Patient also reports she has a little bit of nausea from the postnasal drip.  Patient initially stated she felt dizzy at triage but she describes a feeling as more being an off-balance sensation without dizziness or room spinning.  Patient denies fever, ear pain or pressure, sore throat, shortness of breath or wheezing, or GI complaints.  Past Medical History:  Diagnosis Date  . Anemia    h/o  . Anxiety   . Arthritis    right hip  . Asthma    well controlled  . Cellulitis   . Coronary artery disease   . Diabetes mellitus without complication (HCC)   . GERD (gastroesophageal reflux disease)   . Helicobacter pylori (H. pylori) infection   . Hypercholesteremia   . Hypertension     Patient Active Problem List   Diagnosis Date Noted  . Angina pectoris (HCC)   . Diabetes type 2, uncontrolled (HCC)   . Anxiety disorder   . Hyperlipidemia   . Pain in the chest   . Chest pain 08/30/2015    Past Surgical History:  Procedure Laterality Date  . ABDOMINAL HYSTERECTOMY    . APPENDECTOMY    . BREAST CYST EXCISION Right   . BREAST CYST EXCISION Left   . CHOLECYSTECTOMY    . LIPOMA EXCISION     back of head and chest area  . SHOULDER ARTHROSCOPY WITH OPEN ROTATOR CUFF REPAIR Right 06/29/2020   Procedure: Right shoulder arthroscopy with debridement, decompression, repair of a massive rotator cuff tear, and biceps tenodesis.;  Surgeon: Christena Flake, MD;   Location: [facility];  Service: Orthopedics;  Laterality: Right;    OB History   No obstetric history on file.      Home Medications    Prior to Admission medications   Medication Sig Start Date End Date Taking? Authorizing Provider  albuterol (PROVENTIL HFA;VENTOLIN HFA) 108 (90 Base) MCG/ACT inhaler Inhale 2 puffs into the lungs every 6 (six) hours as needed for wheezing. 12/30/16  Yes Renford Dills, NP  cetirizine (ZYRTEC) 10 MG tablet Take 1 tablet (10 mg total) by mouth daily. Patient taking differently: Take 10 mg by mouth every morning. 12/20/18  Yes Cook, Jayce G, DO  escitalopram (LEXAPRO) 20 MG tablet Take 20 mg by mouth at bedtime.  09/30/18 03/08/21 Yes [provider]  fluticasone furoate-vilanterol (BREO ELLIPTA) 200-25 MCG/INH AEPB Inhale 1 puff into the lungs every morning.  02/21/17  Yes [provider]  ibuprofen (ADVIL) 200 MG tablet Take 600 mg by mouth every 6 (six) hours as needed.   Yes [provider]  ipratropium (ATROVENT) 0.06 % nasal spray Place 2 sprays into both nostrils 4 (four) times daily. 03/08/21  Yes Becky Augusta, NP  metFORMIN (GLUCOPHAGE) 500 MG tablet Take 500 mg by mouth 2 (two) times daily with a meal.   Yes [provider]  olmesartan-hydrochlorothiazide (  BENICAR HCT) 40-25 MG tablet Take 1 tablet by mouth daily. 09/10/20 09/10/21 Yes [provider]  omeprazole (PRILOSEC) 20 MG capsule Take 20 mg by mouth 2 (two) times daily before a meal.   Yes [provider]  Semaglutide (OZEMPIC, 0.25 OR 0.5 MG/DOSE, [place]) Inject 0.25 mg into the skin every 7 (seven) days.   Yes [provider]  atenolol-chlorthalidone (TENORETIC) 50-25 MG per tablet Take 1 tablet by mouth every morning.   10/17/20  [provider]  fluticasone (FLONASE) 50 MCG/ACT nasal spray Place 2 sprays into both nostrils daily. 11/29/17 03/08/21  Tommie Sams, DO  simvastatin (ZOCOR) 40 MG tablet Take 40 mg by mouth at bedtime.   03/08/21  [provider]    Family History Family History  Problem Relation Age of Onset  . Cancer Mother   . Cancer Father   . Breast cancer Neg Hx     Social History Social History   Tobacco Use  . Smoking status: Former Smoker    Years: 15.00    Types: Cigarettes  . Smokeless tobacco: Never Used  Vaping Use  . Vaping Use: Never used  Substance Use Topics  . Alcohol use: No  . Drug use: No     Allergies   Shellfish allergy, Aspirin, and Lisinopril   Review of Systems Review of Systems  Constitutional: Negative for activity change, appetite change and fever.  HENT: Positive for congestion, postnasal drip and rhinorrhea. Negative for ear pain and sore throat.   Eyes: Positive for discharge.  Respiratory: Positive for cough. Negative for shortness of breath and wheezing.   Gastrointestinal: Negative for diarrhea and nausea.  Skin: Negative for rash.  Neurological: Negative for dizziness and light-headedness.  Hematological: Negative.   Psychiatric/Behavioral: Negative.      Physical Exam Triage Vital Signs ED Triage Vitals  Enc Vitals Group     BP 03/08/21 1234 (!) 152/82     Pulse Rate 03/08/21 1234 70     Resp 03/08/21 1234 18     Temp 03/08/21 1234 98.2 F (36.8 C)     Temp Source 03/08/21 1234 Oral     SpO2 03/08/21 1234 98 %     Weight 03/08/21 1232 241 lb (109.3 kg)     Height 03/08/21 1232 5\' 3"  (1.6 m)     Head Circumference --      Peak Flow --      Pain Score 03/08/21 1232 0     Pain Loc --      Pain Edu? --      Excl. in GC? --    No data found.  Updated Vital Signs BP (!) 152/82 (BP Location: Right Arm)   Pulse 70   Temp 98.2 F (36.8 C) (Oral)   Resp 18   Ht 5\' 3"  (1.6 m)   Wt 241 lb (109.3 kg)   SpO2 98%   BMI 42.69 kg/m   Visual Acuity Right Eye Distance:   Left Eye Distance:   Bilateral Distance:    Right Eye Near:   Left Eye Near:    Bilateral Near:     Physical Exam Vitals and nursing note reviewed.   Constitutional:      General: She is not in acute distress.    Appearance: Normal appearance. She is not ill-appearing.  HENT:     Head: Normocephalic and atraumatic.     Right Ear: Tympanic membrane, ear canal and external ear normal. There is no impacted cerumen.  Left Ear: Tympanic membrane, ear canal and external ear normal. There is no impacted cerumen.     Nose: Congestion and rhinorrhea present.     Mouth/Throat:     Mouth: Mucous membranes are moist.     Pharynx: Oropharynx is clear. No oropharyngeal exudate or posterior oropharyngeal erythema.  Cardiovascular:     Rate and Rhythm: Normal rate and regular rhythm.     Pulses: Normal pulses.     Heart sounds: Normal heart sounds. No murmur heard. No gallop.   Pulmonary:     Effort: Pulmonary effort is normal.     Breath sounds: Normal breath sounds. No wheezing, rhonchi or rales.  Musculoskeletal:     Cervical back: Normal range of motion and neck supple.  Lymphadenopathy:     Cervical: No cervical adenopathy.  Skin:    Capillary Refill: Capillary refill takes less than 2 seconds.     Findings: No rash.  Neurological:     General: No focal deficit present.     Mental Status: She is alert and oriented to person, place, and time.  Psychiatric:        Mood and Affect: Mood normal.        Behavior: Behavior normal.        Thought Content: Thought content normal.        Judgment: Judgment normal.      UC Treatments / Results  Labs (all labs ordered are listed, but only abnormal results are displayed) Labs Reviewed - No data to display  EKG   Radiology No results found.  Procedures Procedures (including critical care time)  Medications Ordered in UC Medications - No data to display  Initial Impression / Assessment and Plan / UC Course  I have reviewed the triage vital signs and the nursing notes.  Pertinent labs & imaging results that were available during my care of the patient were reviewed by me and  considered in my medical decision making (see chart for details).   Patient is a very pleasant 65 year old female here for evaluation of cold symptoms have been going for last 2 to 3 days.  Patient states that she has a history of allergies and she just recently started taking some over-the-counter allergy medicine which has helped some with her symptoms.  Her symptoms have mostly been postnasal drip, runny nose, cough or productive for a clear sputum and watery eyes.  Patient's physical exam reveals pearly gray tympanic membranes bilaterally with a normal light reflex and clear external auditory canals.  Nasal mucosa is edematous and pale with a bluish hue and scant clear nasal discharge.  Posterior oropharynx is unremarkable aside from clear postnasal drip.  No cervical lymphadenopathy.  Lungs are clear to auscultation all fields.  Patient's exam is consistent with allergic rhinitis.  I encourage patient to switch to Allegra 180 mg daily for better control of her allergy symptoms and will also prescribe Atrovent nasal spray to help control her rhinorrhea.  Patient advised to return for new or worsening symptoms.   Final Clinical Impressions(s) / UC Diagnoses   Final diagnoses:  Allergic rhinitis, unspecified seasonality, unspecified trigger     Discharge Instructions     Switch your oral antihistamine to over-the-counter Allegra 180 mg once daily.  Continue to use your Nettie pot with distilled water 2-3 times a day to help wash out pollen particles and other environmental contaminants that may be leading to swelling of your nasal tissue and increased production of mucus.  Use the  Atrovent nasal spray, 2 squirts in each nostril ,4 times a day as needed for nasal congestion runny nose.  This will help with postnasal drip.  Increase your oral fluid intake to keep your mucus thin and make it easier for your body to clear.  Return for reevaluation, or see your primary care provider, for any new  or worsening symptoms.    ED Prescriptions    Medication Sig Dispense Auth. Provider   ipratropium (ATROVENT) 0.06 % nasal spray Place 2 sprays into both nostrils 4 (four) times daily. 15 mL Becky Augustayan, Macey Wurtz, NP     PDMP not reviewed this encounter.   Becky Augustayan, Terrionna Bridwell, NP 03/08/21 1330

## 2021-03-08 NOTE — Discharge Instructions (Addendum)
Switch your oral antihistamine to over-the-counter Allegra 180 mg once daily.  Continue to use your Nettie pot with distilled water 2-3 times a day to help wash out pollen particles and other environmental contaminants that may be leading to swelling of your nasal tissue and increased production of mucus.  Use the Atrovent nasal spray, 2 squirts in each nostril ,4 times a day as needed for nasal congestion runny nose.  This will help with postnasal drip.  Increase your oral fluid intake to keep your mucus thin and make it easier for your body to clear.  Return for reevaluation, or see your primary care provider, for any new or worsening symptoms.

## 2021-03-08 NOTE — ED Triage Notes (Signed)
Patient complains of dizziness and nasal congestion x 2-3 days. States that she has been taking zyrtec and ibuprofen with little improvement.

## 2021-08-18 ENCOUNTER — Ambulatory Visit
Admission: EM | Admit: 2021-08-18 | Discharge: 2021-08-18 | Disposition: A | Discharge status: Home / Self Care | Payer: BC Managed Care – PPO | Attending: Emergency Medicine | Admitting: Emergency Medicine

## 2021-08-18 ENCOUNTER — Encounter: Payer: Self-pay | Admitting: Emergency Medicine

## 2021-08-18 ENCOUNTER — Other Ambulatory Visit: Payer: Self-pay

## 2021-08-18 DIAGNOSIS — H6501 Acute serous otitis media, right ear: Secondary | ICD-10-CM

## 2021-08-18 DIAGNOSIS — J309 Allergic rhinitis, unspecified: Secondary | ICD-10-CM | POA: Diagnosis not present

## 2021-08-18 DIAGNOSIS — R42 Dizziness and giddiness: Secondary | ICD-10-CM

## 2021-08-18 MED ORDER — IPRATROPIUM BROMIDE 0.06 % NA SOLN: 2.0000 | Freq: Four times a day (QID) | NASAL | 12 refills | Status: DC

## 2021-08-18 MED ORDER — MECLIZINE HCL 25 MG PO TABS: 25.0000 mg | ORAL_TABLET | Freq: Three times a day (TID) | ORAL | 0 refills | Status: AC | PRN

## 2021-08-18 NOTE — Discharge Instructions (Addendum)
Take an over-the-counter antihistamine such as Allegra 180 mg once daily for control of your allergy symptoms.  Perform sinus irrigation with a NeilMed sinus rinse kit and distilled water 2-3 times a day to wash away pollen particles and mold spores which could be attributing to nasal congestion and mucus production.  Use the Atrovent nasal spray, 2 squirts up each nostril 4 times a day, as needed for nasal congestion and postnasal drip.  Use the Antivert every 8 hours as needed for dizziness.   Return for reevaluation for any new or worsening symptoms.

## 2021-08-18 NOTE — ED Provider Notes (Signed)
[facility]    CSN: [id number] Arrival date & time: 08/18/21  5638      History   Chief Complaint Chief Complaint  Patient presents with   Dizziness   Nausea    HPI Vanessa Hampton is a 65 y.o. female.   HPI  65 year old female here for evaluation of ear complaints.  Patient reports that for the last 3 days she has been experiencing slight right ear pain that is associated with dizziness and nausea.  The dizziness is intermittent and is worse if she bends over or changes position.  She describes the dizziness more as a feeling of being off balance versus room spinning.  Patient does have a history of vertigo but the symptoms are different.  The heaviness in her frontal area that she describes as sinus pressure and she has recently developed a runny nose.  She denies any fever, changes to her hearing, ringing in ears, nasal discharge, cough, sore throat, headache, or changes in her vision.  She also denies chest pain or shortness of breath and numbness, tingling, or weakness in any of her extremities.  Past Medical History:  Diagnosis Date   Anemia    h/o   Anxiety    Arthritis    right hip   Asthma    well controlled   Cellulitis    Coronary artery disease    Diabetes mellitus without complication (HCC)    GERD (gastroesophageal reflux disease)    Helicobacter pylori (H. pylori) infection    Hypercholesteremia    Hypertension     Patient Active Problem List   Diagnosis Date Noted   Angina pectoris ([place])    Diabetes type 2, uncontrolled ([place])    Anxiety disorder    Hyperlipidemia    Pain in the chest    Chest pain 08/30/2015    Past Surgical History:  Procedure Laterality Date   ABDOMINAL HYSTERECTOMY     APPENDECTOMY     BREAST CYST EXCISION Right    BREAST CYST EXCISION Left    CHOLECYSTECTOMY     LIPOMA EXCISION     back of head and chest area   SHOULDER ARTHROSCOPY WITH OPEN ROTATOR CUFF REPAIR Right 06/29/2020   Procedure: Right  shoulder arthroscopy with debridement, decompression, repair of a massive rotator cuff tear, and biceps tenodesis.;  Surgeon: Corky Mull, MD;  Location: [facility] ORS;  Service: Orthopedics;  Laterality: Right;    OB History   No obstetric history on file.      Home Medications    Prior to Admission medications   Medication Sig Start Date End Date Taking? Authorizing Provider  ipratropium (ATROVENT) 0.06 % nasal spray Place 2 sprays into both nostrils 4 (four) times daily. 08/18/21  Yes Margarette Canada, NP  meclizine (ANTIVERT) 25 MG tablet Take 1 tablet (25 mg total) by mouth 3 (three) times daily as needed for dizziness. 08/18/21  Yes Margarette Canada, NP  albuterol (PROVENTIL HFA;VENTOLIN HFA) 108 (90 Base) MCG/ACT inhaler Inhale 2 puffs into the lungs every 6 (six) hours as needed for wheezing. 12/30/16   Marylene Land, NP  cetirizine (ZYRTEC) 10 MG tablet Take 1 tablet (10 mg total) by mouth daily. Patient taking differently: Take 10 mg by mouth every morning. 12/20/18   Coral Spikes, DO  escitalopram (LEXAPRO) 20 MG tablet Take 20 mg by mouth at bedtime.  09/30/18 03/08/21  [provider]  fluticasone furoate-vilanterol (BREO ELLIPTA) 200-25 MCG/INH AEPB Inhale 1 puff into the lungs  every morning.  02/21/17   [provider]  ibuprofen (ADVIL) 200 MG tablet Take 600 mg by mouth every 6 (six) hours as needed.    [provider]  metFORMIN (GLUCOPHAGE) 500 MG tablet Take 500 mg by mouth 2 (two) times daily with a meal.    [provider]  olmesartan-hydrochlorothiazide (BENICAR HCT) 40-25 MG tablet Take 1 tablet by mouth daily. 09/10/20 09/10/21  [provider]  omeprazole (PRILOSEC) 20 MG capsule Take 20 mg by mouth 2 (two) times daily before a meal.    [provider]  Semaglutide (OZEMPIC, 0.25 OR 0.5 MG/DOSE, [place]) Inject 0.25 mg into the skin every 7 (seven) days.    [provider]  atenolol-chlorthalidone (TENORETIC) 50-25 MG per  tablet Take 1 tablet by mouth every morning.   10/17/20  [provider]  fluticasone (FLONASE) 50 MCG/ACT nasal spray Place 2 sprays into both nostrils daily. 11/29/17 03/08/21  Coral Spikes, DO  simvastatin (ZOCOR) 40 MG tablet Take 40 mg by mouth at bedtime.  03/08/21  [provider]    Family History Family History  Problem Relation Age of Onset   Cancer Mother    Cancer Father    Breast cancer Neg Hx     Social History Social History   Tobacco Use   Smoking status: Former    Years: 15.00    Types: Cigarettes   Smokeless tobacco: Never  Vaping Use   Vaping Use: Never used  Substance Use Topics   Alcohol use: No   Drug use: No     Allergies   Shellfish allergy, Aspirin, and Lisinopril   Review of Systems Review of Systems  Constitutional:  Negative for activity change, appetite change and fever.  HENT:  Positive for ear pain, rhinorrhea and sinus pressure. Negative for congestion, ear discharge, hearing loss, sore throat and tinnitus.   Eyes:  Negative for visual disturbance.  Respiratory:  Negative for cough, shortness of breath and wheezing.   Cardiovascular:  Negative for chest pain.  Gastrointestinal:  Positive for nausea. Negative for vomiting.  Skin:  Negative for rash.  Neurological:  Positive for dizziness. Negative for headaches.  Hematological: Negative.   Psychiatric/Behavioral: Negative.      Physical Exam Triage Vital Signs ED Triage Vitals  Enc Vitals Group     BP 08/18/21 0834 (!) 150/70     Pulse Rate 08/18/21 0834 68     Resp 08/18/21 0834 18     Temp 08/18/21 0834 98.2 F (36.8 C)     Temp Source 08/18/21 0833 Oral     SpO2 08/18/21 0834 99 %     Weight --      Height --      Head Circumference --      Peak Flow --      Pain Score --      Pain Loc --      Pain Edu? --      Excl. in [place]? --    No data found.  Updated Vital Signs BP (!) 150/70   Pulse 68   Temp 98.2 F (36.8 C)   Resp 18   SpO2 99%   Visual  Acuity Right Eye Distance:   Left Eye Distance:   Bilateral Distance:    Right Eye Near:   Left Eye Near:    Bilateral Near:     Physical Exam Vitals and nursing note reviewed.  Constitutional:      General: She is  not in acute distress.    Appearance: Normal appearance. She is not ill-appearing.  HENT:     Head: Normocephalic and atraumatic.     Right Ear: Ear canal and external ear normal. There is no impacted cerumen.     Left Ear: Tympanic membrane, ear canal and external ear normal. There is no impacted cerumen.     Nose: Congestion and rhinorrhea present.     Mouth/Throat:     Mouth: Mucous membranes are moist.     Pharynx: Oropharynx is clear. No posterior oropharyngeal erythema.  Eyes:     Extraocular Movements: Extraocular movements intact.     Pupils: Pupils are equal, round, and reactive to light.  Cardiovascular:     Rate and Rhythm: Normal rate and regular rhythm.     Pulses: Normal pulses.     Heart sounds: Normal heart sounds. No murmur heard.   No gallop.  Pulmonary:     Effort: Pulmonary effort is normal.     Breath sounds: Normal breath sounds. No wheezing, rhonchi or rales.  Musculoskeletal:     Cervical back: Normal range of motion and neck supple.  Lymphadenopathy:     Cervical: No cervical adenopathy.  Skin:    General: Skin is warm and dry.     Capillary Refill: Capillary refill takes less than 2 seconds.     Findings: No erythema or rash.  Neurological:     General: No focal deficit present.     Mental Status: She is alert and oriented to person, place, and time.     Sensory: No sensory deficit.     Motor: No weakness.  Psychiatric:        Mood and Affect: Mood normal.        Behavior: Behavior normal.        Thought Content: Thought content normal.        Judgment: Judgment normal.     UC Treatments / Results  Labs (all labs ordered are listed, but only abnormal results are displayed) Labs Reviewed - No data to  display  EKG   Radiology No results found.  Procedures Procedures (including critical care time)  Medications Ordered in UC Medications - No data to display  Initial Impression / Assessment and Plan / UC Course  I have reviewed the triage vital signs and the nursing notes.  Pertinent labs & imaging results that were available during my care of the patient were reviewed by me and considered in my medical decision making (see chart for details).  Is a very pleasant, nontoxic-appearing 65 year old female here for evaluation of right ear pain, dizziness, and nausea that have been going on for the past 3 days.  Her symptoms are intermittent and worse with changes of position or when she bends over.  She states it is more of a feeling of being off balance versus room spinning as when she has vertigo.  She has had some frontal heaviness that she describes as sinus pressure and she has developed a runny nose without nasal discharge.  She does have a history of seasonal allergies and she takes [employer].  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  She denies fever, changes to hearing, ringing in ears, changes to her vision, nasal discharge, sore throat, cough, or headache.  Patient's physical exam reveals a mild serous effusion behind  the right tympanic membrane with very mild redness to the rim of the TM.  The external auditory canal is clear.  Left tympanic membrane is pearly gray with a normal light reflex and clear external auditory canal.  Nasal mucosa is markedly edematous and pale with scant clear nasal discharge.  Patient does have tenderness to percussion over frontal sinuses.  Oropharyngeal exam is benign.  No cervical lymphadenopathy appreciated on exam.  Cardiopulmonary exam is benign with clear lung sounds in all fields.  Suspect patient's dizziness  is secondary to serous otitis in the right ear which has been compounded by her allergy symptoms.  We will have her continue her allergy regimen and add on Atrovent nasal spray 4 times daily.  Of also asked the patient to perform sinus irrigation to help rinse her sinuses of retained mucus.  We will prescribe Antivert to help with dizziness and middle ear fluid.  Patient vies to return for new or worsening symptoms.   Final Clinical Impressions(s) / UC Diagnoses   Final diagnoses:  Allergic rhinitis, unspecified seasonality, unspecified trigger  Dizziness  Non-recurrent acute serous otitis media of right ear     Discharge Instructions      Take an over-the-counter antihistamine such as Allegra 180 mg once daily for control of your allergy symptoms.  Perform sinus irrigation with a NeilMed sinus rinse kit and distilled water 2-3 times a day to wash away pollen particles and mold spores which could be attributing to nasal congestion and mucus production.  Use the Atrovent nasal spray, 2 squirts up each nostril 4 times a day, as needed for nasal congestion and postnasal drip.  Use the Antivert every 8 hours as needed for dizziness.   Return for reevaluation for any new or worsening symptoms.      ED Prescriptions     Medication Sig Dispense Auth. Provider   ipratropium (ATROVENT) 0.06 % nasal spray Place 2 sprays into both nostrils 4 (four) times daily. 15 mL Margarette Canada, NP   meclizine (ANTIVERT) 25 MG tablet Take 1 tablet (25 mg total) by mouth 3 (three) times daily as needed for dizziness. 30 tablet Margarette Canada, NP      PDMP not reviewed this encounter.   Margarette Canada, NP 08/18/21 [phone]

## 2021-08-18 NOTE — ED Triage Notes (Signed)
Pt is present today with dizziness and nausea. Pt states that she also has slight ear pain in the right ear. Pt states that her sx Monday

## 2021-09-17 ENCOUNTER — Ambulatory Visit
Admission: EM | Admit: 2021-09-17 | Discharge: 2021-09-17 | Disposition: A | Discharge status: Home / Self Care | Payer: BC Managed Care – PPO | Attending: Physician Assistant | Admitting: Physician Assistant

## 2021-09-17 ENCOUNTER — Other Ambulatory Visit: Payer: Self-pay

## 2021-09-17 DIAGNOSIS — L720 Epidermal cyst: Secondary | ICD-10-CM

## 2021-09-17 DIAGNOSIS — L089 Local infection of the skin and subcutaneous tissue, unspecified: Secondary | ICD-10-CM

## 2021-09-17 MED ORDER — DOXYCYCLINE HYCLATE 100 MG PO CAPS: 100.0000 mg | ORAL_CAPSULE | Freq: Two times a day (BID) | ORAL | 0 refills | Status: AC

## 2021-09-17 NOTE — Discharge Instructions (Signed)
-  You have an infected epidermoid cyst.  I have sent antibiotics to the pharmacy.  Complete full course. -Apply warm compresses to the area and take Tylenol and or ibuprofen as needed for pain relief. -If no improvement or if symptoms worsen over the next couple of days you should be seen again.  Suggest following up with dermatology.  As we discussed the entire cyst needs to be removed or it can recur.

## 2021-09-17 NOTE — ED Triage Notes (Signed)
Pt has had multiple skin abscesses on around her chest area. These instances started about 3 years ago. Newest spots around her bra line on right side. States the spots were there a year ago but haven't bigger until this week.

## 2021-09-18 NOTE — ED Provider Notes (Signed)
[facility]    CSN: [id number] Arrival date & time: 09/17/21  1310      History   Chief Complaint Chief Complaint  Patient presents with   Abscess    HPI Vanessa Hampton is a 65 y.o. female presenting for an area of redness and swelling for the past week on the right thorax.  Patient says she has had a lump in this area for a long time but it just became painful and the skin became red.  She says it hurts to touch.  She denies any drainage from the area.  She admits to a history of what sounds like sebaceous cysts.  Patient says she normally has to have these cut out.  She presently denies any fever or fatigue.  She has not treated condition in any way herself.  She has no other complaints.  HPI  Past Medical History:  Diagnosis Date   Anemia    h/o   Anxiety    Arthritis    right hip   Asthma    well controlled   Cellulitis    Coronary artery disease    Diabetes mellitus without complication (HCC)    GERD (gastroesophageal reflux disease)    Helicobacter pylori (H. pylori) infection    Hypercholesteremia    Hypertension     Patient Active Problem List   Diagnosis Date Noted   Angina pectoris (HCC)    Diabetes type 2, uncontrolled    Anxiety disorder    Hyperlipidemia    Pain in the chest    Chest pain 08/30/2015    Past Surgical History:  Procedure Laterality Date   ABDOMINAL HYSTERECTOMY     APPENDECTOMY     BREAST CYST EXCISION Right    BREAST CYST EXCISION Left    CHOLECYSTECTOMY     LIPOMA EXCISION     back of head and chest area   SHOULDER ARTHROSCOPY WITH OPEN ROTATOR CUFF REPAIR Right 06/29/2020   Procedure: Right shoulder arthroscopy with debridement, decompression, repair of a massive rotator cuff tear, and biceps tenodesis.;  Surgeon: Christena Flake, MD;  Location: [facility];  Service: Orthopedics;  Laterality: Right;    OB History   No obstetric history on file.      Home Medications    Prior to Admission medications    Medication Sig Start Date End Date Taking? Authorizing Provider  doxycycline (VIBRAMYCIN) 100 MG capsule Take 1 capsule (100 mg total) by mouth 2 (two) times daily for 10 days. 09/17/21 09/27/21 Yes Eusebio Friendly B, PA-C  albuterol (PROVENTIL HFA;VENTOLIN HFA) 108 (90 Base) MCG/ACT inhaler Inhale 2 puffs into the lungs every 6 (six) hours as needed for wheezing. 12/30/16   Renford Dills, NP  cetirizine (ZYRTEC) 10 MG tablet Take 1 tablet (10 mg total) by mouth daily. Patient taking differently: Take 10 mg by mouth every morning. 12/20/18   Tommie Sams, DO  escitalopram (LEXAPRO) 20 MG tablet Take 20 mg by mouth at bedtime.  09/30/18 03/08/21  [provider]  fluticasone furoate-vilanterol (BREO ELLIPTA) 200-25 MCG/INH AEPB Inhale 1 puff into the lungs every morning.  02/21/17   [provider]  ibuprofen (ADVIL) 200 MG tablet Take 600 mg by mouth every 6 (six) hours as needed.    [provider]  ipratropium (ATROVENT) 0.06 % nasal spray Place 2 sprays into both nostrils 4 (four) times daily. 08/18/21   Becky Augusta, NP  meclizine (ANTIVERT) 25 MG tablet Take 1 tablet (25 mg  total) by mouth 3 (three) times daily as needed for dizziness. 08/18/21   Becky Augusta, NP  metFORMIN (GLUCOPHAGE) 500 MG tablet Take 500 mg by mouth 2 (two) times daily with a meal.    [provider]  olmesartan-hydrochlorothiazide (BENICAR HCT) 40-25 MG tablet Take 1 tablet by mouth daily. 09/10/20 09/10/21  [provider]  omeprazole (PRILOSEC) 20 MG capsule Take 20 mg by mouth 2 (two) times daily before a meal.    [provider]  Semaglutide (OZEMPIC, 0.25 OR 0.5 MG/DOSE, [place]) Inject 0.25 mg into the skin every 7 (seven) days.    [provider]  atenolol-chlorthalidone (TENORETIC) 50-25 MG per tablet Take 1 tablet by mouth every morning.   10/17/20  [provider]  fluticasone (FLONASE) 50 MCG/ACT nasal spray Place 2 sprays into both nostrils daily.  11/29/17 03/08/21  Tommie Sams, DO  simvastatin (ZOCOR) 40 MG tablet Take 40 mg by mouth at bedtime.  03/08/21  [provider]    Family History Family History  Problem Relation Age of Onset   Cancer Mother    Cancer Father    Breast cancer Neg Hx     Social History Social History   Tobacco Use   Smoking status: Former    Years: 15.00    Types: Cigarettes   Smokeless tobacco: Never  Vaping Use   Vaping Use: Never used  Substance Use Topics   Alcohol use: No   Drug use: No     Allergies   Shellfish allergy, Aspirin, and Lisinopril   Review of Systems Review of Systems  Constitutional:  Negative for fatigue and fever.  Respiratory:  Negative for shortness of breath.   Cardiovascular:  Negative for chest pain.  Musculoskeletal:  Negative for arthralgias and myalgias.  Skin:  Positive for color change. Negative for wound.  Neurological:  Negative for weakness.    Physical Exam Triage Vital Signs ED Triage Vitals  Enc Vitals Group     BP 09/17/21 1451 (!) 146/83     Pulse Rate 09/17/21 1451 76     Resp 09/17/21 1451 19     Temp 09/17/21 1451 98.5 F (36.9 C)     Temp Source 09/17/21 1451 Oral     SpO2 09/17/21 1451 99 %     Weight 09/17/21 1450 225 lb (102.1 kg)     Height 09/17/21 1450 5\' 3"  (1.6 m)     Head Circumference --      Peak Flow --      Pain Score 09/17/21 1450 0     Pain Loc --      Pain Edu? --      Excl. in [place]? --    No data found.  Updated Vital Signs BP (!) 146/83 (BP Location: Left Arm)   Pulse 76   Temp 98.5 F (36.9 C) (Oral)   Resp 19   Ht 5\' 3"  (1.6 m)   Wt 225 lb (102.1 kg)   SpO2 99%   BMI 39.86 kg/m     Physical Exam Vitals and nursing note reviewed.  Constitutional:      General: She is not in acute distress.    Appearance: Normal appearance. She is not ill-appearing or toxic-appearing.  HENT:     Head: Normocephalic and atraumatic.  Eyes:     General: No scleral icterus.       Right eye: No discharge.         Left eye: No discharge.  Conjunctiva/sclera: Conjunctivae normal.  Cardiovascular:     Rate and Rhythm: Normal rate and regular rhythm.     Heart sounds: Normal heart sounds.  Pulmonary:     Effort: Pulmonary effort is normal. No respiratory distress.     Breath sounds: Normal breath sounds.  Musculoskeletal:     Cervical back: Neck supple.  Skin:    General: Skin is dry.     Comments: Right thorax: 2 cm x 2 cm area of erythema and induration, no fluctuance. No drainage. Area is TTP  Neurological:     General: No focal deficit present.     Mental Status: She is alert. Mental status is at baseline.     Motor: No weakness.     Gait: Gait normal.  Psychiatric:        Mood and Affect: Mood normal.        Behavior: Behavior normal.        Thought Content: Thought content normal.     UC Treatments / Results  Labs (all labs ordered are listed, but only abnormal results are displayed) Labs Reviewed - No data to display  EKG   Radiology No results found.  Procedures Procedures (including critical care time)  Medications Ordered in UC Medications - No data to display  Initial Impression / Assessment and Plan / UC Course  I have reviewed the triage vital signs and the nursing notes.  Pertinent labs & imaging results that were available during my care of the patient were reviewed by me and considered in my medical decision making (see chart for details).  65 year old female presenting for redness and swelling of an area of the right thorax.  Patient has history of these recurrent cysts or abscesses.  She says she has seen her PCP and another provider in the past to have either injected the areas with a corticosteroid or cut them out.  Today she has erythema, swelling and induration without fluctuance of an area of the right thorax.  Area is tender to palpation.  No drainage.  Suspect underlying sebaceous cyst that has become infected.  Treating her at this time with  doxycycline.  Advised if symptoms or not improving she should follow-up with dermatology or PCP.  May return here as well if she cannot see her PCP, but will hold off on any I&D at this point.  Final Clinical Impressions(s) / UC Diagnoses   Final diagnoses:  Infected epidermoid cyst     Discharge Instructions      -You have an infected epidermoid cyst.  I have sent antibiotics to the pharmacy.  Complete full course. -Apply warm compresses to the area and take Tylenol and or ibuprofen as needed for pain relief. -If no improvement or if symptoms worsen over the next couple of days you should be seen again.  Suggest following up with dermatology.  As we discussed the entire cyst needs to be removed or it can recur.   ED Prescriptions     Medication Sig Dispense Auth. Provider   doxycycline (VIBRAMYCIN) 100 MG capsule Take 1 capsule (100 mg total) by mouth 2 (two) times daily for 10 days. 20 capsule Shirlee Latch, PA-C      PDMP not reviewed this encounter.   Shirlee Latch, PA-C 09/18/21 [phone]

## 2022-04-10 ENCOUNTER — Ambulatory Visit (INDEPENDENT_AMBULATORY_CARE_PROVIDER_SITE_OTHER): Payer: BC Managed Care – PPO

## 2022-04-10 ENCOUNTER — Ambulatory Visit
Admission: EM | Admit: 2022-04-10 | Discharge: 2022-04-10 | Disposition: A | Discharge status: Home / Self Care | Payer: BC Managed Care – PPO

## 2022-04-10 ENCOUNTER — Encounter: Payer: Self-pay | Admitting: Emergency Medicine

## 2022-04-10 ENCOUNTER — Other Ambulatory Visit: Payer: Self-pay

## 2022-04-10 DIAGNOSIS — S86912A Strain of unspecified muscle(s) and tendon(s) at lower leg level, left leg, initial encounter: Secondary | ICD-10-CM | POA: Diagnosis not present

## 2022-04-10 DIAGNOSIS — M1712 Unilateral primary osteoarthritis, left knee: Secondary | ICD-10-CM

## 2022-04-10 DIAGNOSIS — M25562 Pain in left knee: Secondary | ICD-10-CM

## 2022-04-10 MED ORDER — MELOXICAM 7.5 MG PO TABS: 7.5000 mg | ORAL_TABLET | Freq: Every day | ORAL | 0 refills | Status: DC

## 2022-04-10 NOTE — ED Provider Notes (Signed)
[facility]    CSN: [id number] Arrival date & time: 04/10/22  1305      History   Chief Complaint Chief Complaint  Patient presents with   Knee Pain    left    HPI Vanessa Hampton is a 66 y.o. female who presents with L knee pain and swelling x 3 days. She had gone for a walk and the pain started after that and gradually is getting worse. Denies injuring herself. 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     Past Medical History:  Diagnosis Date   Anemia    h/o   Anxiety    Arthritis    right hip   Asthma    well controlled   Cellulitis    Coronary artery disease    Diabetes mellitus without complication (HCC)    GERD (gastroesophageal reflux disease)    Helicobacter pylori (H. pylori) infection    Hypercholesteremia    Hypertension     Patient Active Problem List   Diagnosis Date Noted   Angina pectoris (HCC)    Diabetes type 2, uncontrolled    Anxiety disorder    Hyperlipidemia    Pain in the chest    Chest pain 08/30/2015    Past Surgical History:  Procedure Laterality Date   ABDOMINAL HYSTERECTOMY     APPENDECTOMY     BREAST CYST EXCISION Right    BREAST CYST EXCISION Left    CHOLECYSTECTOMY     LIPOMA EXCISION     back of head and chest area   SHOULDER ARTHROSCOPY WITH OPEN ROTATOR CUFF REPAIR Right 06/29/2020   Procedure: Right shoulder arthroscopy with debridement, decompression, repair of a massive rotator cuff tear, and biceps tenodesis.;  Surgeon: Christena Flake, MD;  Location: [facility];  Service: Orthopedics;  Laterality: Right;    OB History   No obstetric history on file.      Home Medications    Prior to Admission medications   Medication Sig Start Date End Date Taking? Authorizing Provider  albuterol (PROVENTIL HFA;VENTOLIN HFA) 108 (90 Base) MCG/ACT inhaler Inhale 2 puffs into the lungs every 6 (six) hours as needed for wheezing. 12/30/16  Yes Renford Dills, NP  cetirizine (ZYRTEC) 10 MG tablet  Take 1 tablet (10 mg total) by mouth daily. Patient taking differently: Take 10 mg by mouth every morning. 12/20/18  Yes Cook, Jayce G, DO  fluticasone furoate-vilanterol (BREO ELLIPTA) 200-25 MCG/INH AEPB Inhale 1 puff into the lungs every morning.  02/21/17  Yes [provider]  meclizine (ANTIVERT) 25 MG tablet Take 1 tablet (25 mg total) by mouth 3 (three) times daily as needed for dizziness. 08/18/21  Yes Becky Augusta, NP  meloxicam (MOBIC) 7.5 MG tablet Take 1 tablet (7.5 mg total) by mouth daily. 04/10/22  Yes Rodriguez-Southworth, Nettie Elm, PA-C  olmesartan-hydrochlorothiazide (BENICAR HCT) 40-25 MG tablet Take 1 tablet by mouth daily. 09/10/20 04/10/22 Yes [provider]  omeprazole (PRILOSEC) 20 MG capsule Take 20 mg by mouth 2 (two) times daily before a meal.   Yes [provider]  Semaglutide (OZEMPIC, 0.25 OR 0.5 MG/DOSE, [place]) Inject 0.25 mg into the skin every 7 (seven) days.   Yes [provider]  sertraline (ZOLOFT) 50 MG tablet Take 1 tablet by mouth daily. 04/07/22 04/07/23 Yes [provider]  ibuprofen (ADVIL) 200 MG tablet Take 600 mg by mouth every 6 (six) hours as needed.    [provider]  ipratropium (  ATROVENT) 0.06 % nasal spray Place 2 sprays into both nostrils 4 (four) times daily. 08/18/21   Becky Augusta, NP  atenolol-chlorthalidone (TENORETIC) 50-25 MG per tablet Take 1 tablet by mouth every morning.   10/17/20  [provider]  fluticasone (FLONASE) 50 MCG/ACT nasal spray Place 2 sprays into both nostrils daily. 11/29/17 03/08/21  Tommie Sams, DO  simvastatin (ZOCOR) 40 MG tablet Take 40 mg by mouth at bedtime.  03/08/21  [provider]    Family History Family History  Problem Relation Age of Onset   Cancer Mother    Cancer Father    Breast cancer Neg Hx     Social History Social History   Tobacco Use   Smoking status: Former    Years: 15.00    Types: Cigarettes   Smokeless tobacco: Never  Vaping  Use   Vaping Use: Never used  Substance Use Topics   Alcohol use: No   Drug use: No     Allergies   Shellfish allergy, Aspirin, and Lisinopril   Review of Systems Review of Systems  Musculoskeletal:  Positive for arthralgias, gait problem and joint swelling.  Skin:  Negative for rash.  Neurological:  Negative for weakness and numbness.    Physical Exam Triage Vital Signs ED Triage Vitals  Enc Vitals Group     BP 04/10/22 1407 (!) 178/86     Pulse Rate 04/10/22 1407 67     Resp 04/10/22 1407 18     Temp 04/10/22 1407 98.1 F (36.7 C)     Temp Source 04/10/22 1407 Oral     SpO2 04/10/22 1407 100 %     Weight 04/10/22 1405 225 lb 1.4 oz (102.1 kg)     Height 04/10/22 1405 5\' 3"  (1.6 m)     Head Circumference --      Peak Flow --      Pain Score 04/10/22 1403 8     Pain Loc --      Pain Edu? --      Excl. in GC? --    No data found.  Updated Vital Signs BP (!) 178/86 (BP Location: Right Arm)   Pulse 67   Temp 98.1 F (36.7 C) (Oral)   Resp 18   Ht 5\' 3"  (1.6 m)   Wt 225 lb 1.4 oz (102.1 kg)   SpO2 100%   BMI 39.87 kg/m  Repated BP 142/90 Visual Acuity Right Eye Distance:   Left Eye Distance:   Bilateral Distance:    Right Eye Near:   Left Eye Near:    Bilateral Near:     Physical Exam Vitals and nursing note reviewed.  Constitutional:      General: She is not in acute distress.    Appearance: She is obese. She is not toxic-appearing.  HENT:     Right Ear: External ear normal.     Left Ear: External ear normal.  Eyes:     General: No scleral icterus.    Conjunctiva/sclera: Conjunctivae normal.  Cardiovascular:     Pulses: Normal pulses.  Musculoskeletal:     Cervical back: Neck supple.     Comments: L knee- with mild swelling, very tender on medial joint region. ROM is normal, but provoked pain with ROM  and felt pain on medial area with lateral collateral testing. Has no laxity.   Skin:    General: Skin is warm and dry.     Capillary Refill:  Capillary refill takes less than  2 seconds.     Findings: No bruising, erythema or rash.  Neurological:     Mental Status: She is alert and oriented to person, place, and time.     Gait: Gait abnormal.  Psychiatric:        Mood and Affect: Mood normal.        Behavior: Behavior normal.        Thought Content: Thought content normal.        Judgment: Judgment normal.     UC Treatments / Results  Labs (all labs ordered are listed, but only abnormal results are displayed) Labs Reviewed - No data to display  EKG   Radiology DG Knee Complete 4 Views Left  Result Date: 04/10/2022 CLINICAL DATA:  Acute left knee pain and swelling. EXAM: LEFT KNEE - COMPLETE 4+ VIEW COMPARISON:  None Available. FINDINGS: No evidence of fracture, dislocation, or joint effusion. Mild narrowing of medial joint space is noted. Mild patellar spurring is noted. Soft tissues are unremarkable. IMPRESSION: Mild degenerative joint disease is noted medially. No acute abnormality seen. Electronically Signed   By: Lupita Raider M.D.   On: 04/10/2022 14:28    Procedures Procedures (including critical care time)  Medications Ordered in UC Medications - No data to display  Initial Impression / Assessment and Plan / UC Course  I have reviewed the triage vital signs and the nursing notes.  Pertinent  imaging results that were available during my care of the patient were reviewed by me and considered in my medical decision making (see chart for details).  She was placed on a knee sleeve and told to wear qd for 1 week, then after that when starting to exercise again.  I placed her on Mobic as noted.  Needs to Fu with ortho if she does not improve.   Final Clinical Impressions(s) / UC Diagnoses   Final diagnoses:  Knee strain, left, initial encounter  Primary osteoarthritis of left knee     Discharge Instructions      Follow up with orthopedics  if you dont get better in a week.  Wear the knee when you have  to be on your feet a lot and when you start exercising again.      ED Prescriptions     Medication Sig Dispense Auth. Provider   meloxicam (MOBIC) 7.5 MG tablet Take 1 tablet (7.5 mg total) by mouth daily. 14 tablet Rodriguez-Southworth, Nettie Elm, PA-C      PDMP not reviewed this encounter.   Garey Ham, PA-C 04/10/22 1506

## 2022-04-10 NOTE — Discharge Instructions (Addendum)
Follow up with orthopedics  if you dont get better in a week.  ?Wear the knee when you have to be on your feet a lot and when you start exercising again.  ?

## 2022-04-10 NOTE — ED Triage Notes (Signed)
Pt c/o left knee pain and swelling. Started about 3 days ago. She states she went for a walk and the pain has gradually gotten worse. ?

## 2022-07-20 ENCOUNTER — Other Ambulatory Visit: Payer: Self-pay | Admitting: Family Medicine

## 2022-07-20 DIAGNOSIS — Z1231 Encounter for screening mammogram for malignant neoplasm of breast: Secondary | ICD-10-CM

## 2022-07-27 ENCOUNTER — Ambulatory Visit
Admission: EM | Admit: 2022-07-27 | Discharge: 2022-07-27 | Disposition: A | Discharge status: Home / Self Care | Payer: BC Managed Care – PPO | Attending: Emergency Medicine | Admitting: Emergency Medicine

## 2022-07-27 ENCOUNTER — Encounter: Payer: Self-pay | Admitting: Emergency Medicine

## 2022-07-27 DIAGNOSIS — J0100 Acute maxillary sinusitis, unspecified: Secondary | ICD-10-CM | POA: Diagnosis present

## 2022-07-27 DIAGNOSIS — Z20822 Contact with and (suspected) exposure to covid-19: Secondary | ICD-10-CM | POA: Diagnosis not present

## 2022-07-27 DIAGNOSIS — Z9109 Other allergy status, other than to drugs and biological substances: Secondary | ICD-10-CM | POA: Insufficient documentation

## 2022-07-27 LAB — SARS CORONAVIRUS 2 BY RT PCR: SARS Coronavirus 2 by RT PCR: NEGATIVE

## 2022-07-27 MED ORDER — OLOPATADINE HCL 0.2 % OP SOLN: 1.0000 [drp] | Freq: Every day | OPHTHALMIC | 0 refills | Status: DC

## 2022-07-27 MED ORDER — AMOXICILLIN-POT CLAVULANATE 875-125 MG PO TABS
1.0000 | ORAL_TABLET | Freq: Two times a day (BID) | ORAL | 0 refills | Status: DC
Start: 2022-07-27 — End: 2023-05-22

## 2022-07-27 NOTE — ED Triage Notes (Signed)
Pt presents with HA, sinus pressure and right ear pain x 3 days

## 2022-07-27 NOTE — ED Provider Notes (Signed)
HPI  SUBJECTIVE:  Vanessa Hampton is a 66 y.o. female who presents with 3 days of right-sided pain and pressure, headache, nasal congestion, itchy, watery eyes, sneezing, postnasal drip, and pressure anterior to her right ear.  No rhinorrhea, fevers, facial swelling, upper dental pain, parotid gland or facial swelling.  No fevers, sore throat, coughing, wheezing, shortness of breath.  No TMJ pain.  No antibiotics in the past month.  No antipyretic in the past 6 hours.  She states this feels like allergies causing sinus symptoms.  She has tried ibuprofen 600 mg and 200 mg today with mild improvement in her headache.  She has also tried Zyrtec with improvement.  She is not using Flonase.  Symptoms are worse with exposure to known allergens.  She had a negative home COVID test.  No known COVID exposure.  She got 5 doses of the COVID-vaccine.  She has a past medical history of asthma, diabetes, hypertension, hypercholesterolemia, angina, coronary artery disease, environmental/seasonal allergies.  PCP: [facility]    Past Medical History:  Diagnosis Date   Anemia    h/o   Anxiety    Arthritis    right hip   Asthma    well controlled   Cellulitis    Coronary artery disease    Diabetes mellitus without complication (HCC)    GERD (gastroesophageal reflux disease)    Helicobacter pylori (H. pylori) infection    Hypercholesteremia    Hypertension     Past Surgical History:  Procedure Laterality Date   ABDOMINAL HYSTERECTOMY     APPENDECTOMY     BREAST CYST EXCISION Right    BREAST CYST EXCISION Left    CHOLECYSTECTOMY     LIPOMA EXCISION     back of head and chest area   SHOULDER ARTHROSCOPY WITH OPEN ROTATOR CUFF REPAIR Right 06/29/2020   Procedure: Right shoulder arthroscopy with debridement, decompression, repair of a massive rotator cuff tear, and biceps tenodesis.;  Surgeon: Christena Flake, MD;  Location: [facility];  Service: Orthopedics;  Laterality: Right;     Family History  Problem Relation Age of Onset   Cancer Mother    Cancer Father    Breast cancer Neg Hx     Social History   Tobacco Use   Smoking status: Former    Years: 15.00    Types: Cigarettes   Smokeless tobacco: Never  Vaping Use   Vaping Use: Never used  Substance Use Topics   Alcohol use: No   Drug use: No    No current facility-administered medications for this encounter.  Current Outpatient Medications:    amoxicillin-clavulanate (AUGMENTIN) 875-125 MG tablet, Take 1 tablet by mouth every 12 (twelve) hours., Disp: 14 tablet, Rfl: 0   Olopatadine HCl 0.2 % SOLN, Apply 1 drop to eye daily., Disp: 2.5 mL, Rfl: 0   albuterol (PROVENTIL HFA;VENTOLIN HFA) 108 (90 Base) MCG/ACT inhaler, Inhale 2 puffs into the lungs every 6 (six) hours as needed for wheezing., Disp: 1 Inhaler, Rfl: 0   cetirizine (ZYRTEC) 10 MG tablet, Take 1 tablet (10 mg total) by mouth daily. (Patient taking differently: Take 10 mg by mouth every morning.), Disp: 14 tablet, Rfl: 0   fluticasone furoate-vilanterol (BREO ELLIPTA) 200-25 MCG/INH AEPB, Inhale 1 puff into the lungs every morning. , Disp: , Rfl:    ibuprofen (ADVIL) 200 MG tablet, Take 600 mg by mouth every 6 (six) hours as needed., Disp: , Rfl:    meclizine (ANTIVERT) 25 MG tablet, Take  1 tablet (25 mg total) by mouth 3 (three) times daily as needed for dizziness., Disp: 30 tablet, Rfl: 0   meloxicam (MOBIC) 7.5 MG tablet, Take 1 tablet (7.5 mg total) by mouth daily., Disp: 14 tablet, Rfl: 0   olmesartan-hydrochlorothiazide (BENICAR HCT) 40-25 MG tablet, Take 1 tablet by mouth daily., Disp: , Rfl:    omeprazole (PRILOSEC) 20 MG capsule, Take 20 mg by mouth 2 (two) times daily before a meal., Disp: , Rfl:    Semaglutide (OZEMPIC, 0.25 OR 0.5 MG/DOSE, [place]), Inject 0.25 mg into the skin every 7 (seven) days., Disp: , Rfl:    sertraline (ZOLOFT) 50 MG tablet, Take 1 tablet by mouth daily., Disp: , Rfl:   Allergies  Allergen Reactions    Shellfish Allergy Hives   Aspirin Hives   Lisinopril Cough     ROS  As noted in HPI.   Physical Exam  BP (!) 145/88 (BP Location: Left Leg)   Pulse 73   Temp 98.2 F (36.8 C) (Oral)   Resp 16   SpO2 98%   Constitutional: Well developed, well nourished, no acute distress Eyes:  EOMI, conjunctiva normal bilaterally HENT: Normocephalic, atraumatic,mucus membranes moist.  Right maxillary sinus tenderness.  Pale, swollen turbinates on the right more than left.  Positive nasal congestion.  Right external ear, EAC, TM, TMJ normal.  No tenderness with palpation of mastoid, tragus, traction on pinna.  Right jaw mildly tender, but not swollen.  No appreciable facial swelling.  No expressible purulent drainage from the parotid gland.  Dentition normal, nontender Neck: No appreciable cervical lymphadenopathy. Respiratory: Normal inspiratory effort Cardiovascular: Normal rate GI: nondistended skin: No rash, skin intact Musculoskeletal: no deformities Neurologic: Alert & oriented x 3, no focal neuro deficits Psychiatric: Speech and behavior appropriate   ED Course   Medications - No data to display  Orders Placed This Encounter  Procedures   SARS Coronavirus 2 by RT PCR (hospital order, performed in [facility] hospital lab) *cepheid single result test* Anterior Nasal Swab    Standing Status:   Standing    Number of Occurrences:   1    Results for orders placed or performed during the hospital encounter of 07/27/22 (from the past 24 hour(s))  SARS Coronavirus 2 by RT PCR (hospital order, performed in [facility] hospital lab) *cepheid single result test* Anterior Nasal Swab     Status: None   Collection Time: 07/27/22  4:41 PM   Specimen: Anterior Nasal Swab  Result Value Ref Range   SARS Coronavirus 2 by RT PCR NEGATIVE NEGATIVE   No results found.  ED Clinical Impression  1. Acute non-recurrent maxillary sinusitis   2. Environmental allergies   3. Encounter for laboratory  testing for COVID-19 virus      ED Assessment/Plan  Checking COVID per patient request.  She qualifies for antivirals if COVID is positive due to multiple medical comorbidities.  Presentation consistent with a sinusitis secondary to allergies. will have her continue Zyrtec, start Pataday, Flonase.  She states that she does not need a prescription for this.  Also saline nasal irrigation and a wait-and-see prescription of Augmentin for sinusitis.  She also has some tenderness over the right jaw/parotid gland, but there is no appreciable swelling, induration, overlying erythema, increased temperature or expressible purulent drainage.  This may be a very early parotitis.  Augmentin will cover this. Follow-up with PCP as needed.  COVID PCR negative.  Plan as above.  Discussed labs,  MDM, treatment plan,  and plan for follow-up with patient. Discussed sn/sx that should prompt return to the ED. patient agrees with plan.   Meds ordered this encounter  Medications   Olopatadine HCl 0.2 % SOLN    Sig: Apply 1 drop to eye daily.    Dispense:  2.5 mL    Refill:  0   amoxicillin-clavulanate (AUGMENTIN) 875-125 MG tablet    Sig: Take 1 tablet by mouth every 12 (twelve) hours.    Dispense:  14 tablet    Refill:  0      *This clinic note was created using Scientist, clinical (histocompatibility and immunogenetics). Therefore, there may be occasional mistakes despite careful proofreading.  ?    Domenick Gong, MD 07/27/22 1744

## 2022-07-27 NOTE — Discharge Instructions (Addendum)
We will contact you if and only if your COVID comes back positive.  If you do not hear from [place] by the end of the day, you can assume that it is negative.  You can also call here and get a result.  Continue Zyrtec, start Flonase, saline nasal irrigation with a [facility]er Med rinse and distilled water or with your lavage system.  Do this once or twice a day.  Pataday for the eyes.  I am sending you home with a wait-and-see prescription of Augmentin.  If your sinus pain and pressure gets worse despite the antihistamines, nasal steroids and saline nasal irrigation, if you start having fevers, or if the pain along the side your face gets worse, go ahead and start the Augmentin.

## 2022-08-15 ENCOUNTER — Ambulatory Visit: Payer: Medicare PPO

## 2022-08-30 ENCOUNTER — Ambulatory Visit
Admission: RE | Admit: 2022-08-30 | Discharge: 2022-08-30 | Disposition: A | Discharge status: Home / Self Care | Payer: Medicare PPO | Source: Ambulatory Visit | Attending: Family Medicine | Admitting: Family Medicine

## 2022-08-30 DIAGNOSIS — Z1231 Encounter for screening mammogram for malignant neoplasm of breast: Secondary | ICD-10-CM | POA: Diagnosis present

## 2023-05-13 ENCOUNTER — Ambulatory Visit
Admission: EM | Admit: 2023-05-13 | Discharge: 2023-05-13 | Disposition: A | Discharge status: Home / Self Care | Payer: Medicare PPO | Attending: Physician Assistant | Admitting: Physician Assistant

## 2023-05-13 DIAGNOSIS — J3489 Other specified disorders of nose and nasal sinuses: Secondary | ICD-10-CM | POA: Insufficient documentation

## 2023-05-13 DIAGNOSIS — J069 Acute upper respiratory infection, unspecified: Secondary | ICD-10-CM | POA: Diagnosis present

## 2023-05-13 DIAGNOSIS — J029 Acute pharyngitis, unspecified: Secondary | ICD-10-CM | POA: Diagnosis not present

## 2023-05-13 LAB — GROUP A STREP BY PCR: Group A Strep by PCR: NOT DETECTED

## 2023-05-13 NOTE — ED Provider Notes (Signed)
[facility]    CSN: [id number] Arrival date & time: 05/13/23  1251      History   Chief Complaint Chief Complaint  Patient presents with   Sore Throat    HPI Vanessa Hampton is a 67 y.o. female presenting for sore throat, nasal congestion, neck pain, cough, sinus pressure and fatigue x 1 week. Symptoms got a little worse yesterday but is  better today. Had 1 episode of blood streaked sputum yesterday.  Denies fever, chest pain, shortness of breath, vomiting or diarrhea. Her grandchild has been ill with a cough and she babysat him this weekend.  Has taken Benadryl and Zyrtec without relief.  Past medical history significant for asthma, allergies, GERD, diabetes, CAD, hypertension and hyperlipidemia.  HPI  Past Medical History:  Diagnosis Date   Anemia    h/o   Anxiety    Arthritis    right hip   Asthma    well controlled   Cellulitis    Coronary artery disease    Diabetes mellitus without complication (HCC)    GERD (gastroesophageal reflux disease)    Helicobacter pylori (H. pylori) infection    Hypercholesteremia    Hypertension     Patient Active Problem List   Diagnosis Date Noted   Angina pectoris (HCC)    Diabetes type 2, uncontrolled    Anxiety disorder    Hyperlipidemia    Pain in the chest    Chest pain 08/30/2015    Past Surgical History:  Procedure Laterality Date   ABDOMINAL HYSTERECTOMY     APPENDECTOMY     BREAST CYST EXCISION Right    BREAST CYST EXCISION Left    CHOLECYSTECTOMY     LIPOMA EXCISION     back of head and chest area   SHOULDER ARTHROSCOPY WITH OPEN ROTATOR CUFF REPAIR Right 06/29/2020   Procedure: Right shoulder arthroscopy with debridement, decompression, repair of a massive rotator cuff tear, and biceps tenodesis.;  Surgeon: Christena Flake, MD;  Location: [facility];  Service: Orthopedics;  Laterality: Right;    OB History   No obstetric history on file.      Home Medications    Prior to Admission  medications   Medication Sig Start Date End Date Taking? Authorizing Provider  albuterol (PROVENTIL HFA;VENTOLIN HFA) 108 (90 Base) MCG/ACT inhaler Inhale 2 puffs into the lungs every 6 (six) hours as needed for wheezing. 12/30/16  Yes Renford Dills, NP  cetirizine (ZYRTEC) 10 MG tablet Take 1 tablet (10 mg total) by mouth daily. Patient taking differently: Take 10 mg by mouth every morning. 12/20/18  Yes Cook, Jayce G, DO  fluticasone furoate-vilanterol (BREO ELLIPTA) 200-25 MCG/INH AEPB Inhale 1 puff into the lungs every morning.  02/21/17  Yes [provider]  ibuprofen (ADVIL) 200 MG tablet Take 600 mg by mouth every 6 (six) hours as needed.   Yes [provider]  meclizine (ANTIVERT) 25 MG tablet Take 1 tablet (25 mg total) by mouth 3 (three) times daily as needed for dizziness. 08/18/21  Yes Becky Augusta, NP  meloxicam (MOBIC) 7.5 MG tablet Take 1 tablet (7.5 mg total) by mouth daily. 04/10/22  Yes Rodriguez-Southworth, Nettie Elm, PA-C  Olopatadine HCl 0.2 % SOLN Apply 1 drop to eye daily. 07/27/22  Yes Domenick Gong, MD  omeprazole (PRILOSEC) 20 MG capsule Take 20 mg by mouth 2 (two) times daily before a meal.   Yes [provider]  Semaglutide (OZEMPIC, 0.25 OR 0.5 MG/DOSE, [place]) Inject 0.25 mg  into the skin every 7 (seven) days.   Yes [provider]  amoxicillin-clavulanate (AUGMENTIN) 875-125 MG tablet Take 1 tablet by mouth every 12 (twelve) hours. 07/27/22   Domenick Gong, MD  olmesartan-hydrochlorothiazide (BENICAR HCT) 40-25 MG tablet Take 1 tablet by mouth daily. 09/10/20 04/10/22  [provider]  atenolol-chlorthalidone (TENORETIC) 50-25 MG per tablet Take 1 tablet by mouth every morning.   10/17/20  [provider]  fluticasone (FLONASE) 50 MCG/ACT nasal spray Place 2 sprays into both nostrils daily. 11/29/17 03/08/21  Tommie Sams, DO  simvastatin (ZOCOR) 40 MG tablet Take 40 mg by mouth at bedtime.  03/08/21  [provider]     Family History Family History  Problem Relation Age of Onset   Cancer Mother    Cancer Father    Breast cancer Neg Hx     Social History Social History   Tobacco Use   Smoking status: Former    Years: 15    Types: Cigarettes   Smokeless tobacco: Never  Vaping Use   Vaping Use: Never used  Substance Use Topics   Alcohol use: No   Drug use: No     Allergies   Shellfish allergy, Aspirin, and Lisinopril   Review of Systems Review of Systems  Constitutional:  Positive for fatigue. Negative for chills, diaphoresis and fever.  HENT:  Positive for congestion, rhinorrhea, sinus pressure and sore throat. Negative for ear pain and sinus pain.   Respiratory:  Positive for cough. Negative for shortness of breath.   Cardiovascular:  Negative for chest pain.  Gastrointestinal:  Negative for abdominal pain, nausea and vomiting.  Musculoskeletal:  Positive for neck pain. Negative for arthralgias and myalgias.  Skin:  Negative for rash.  Neurological:  Negative for weakness and headaches.  Hematological:  Negative for adenopathy.     Physical Exam Triage Vital Signs ED Triage Vitals  Enc Vitals Group     BP      Pulse      Resp      Temp      Temp src      SpO2      Weight      Height      Head Circumference      Peak Flow      Pain Score      Pain Loc      Pain Edu?      Excl. in GC?    No data found.  Updated Vital Signs BP 135/87 (BP Location: Left Arm)   Pulse 73   Temp 98.2 F (36.8 C) (Oral)   Ht 5\' 3"  (1.6 m)   Wt 220 lb (99.8 kg)   SpO2 95%   BMI 38.97 kg/m    Physical Exam Vitals and nursing note reviewed.  Constitutional:      General: She is not in acute distress.    Appearance: Normal appearance. She is not ill-appearing or toxic-appearing.  HENT:     Head: Normocephalic and atraumatic.     Right Ear: Tympanic membrane, ear canal and external ear normal.     Left Ear: Tympanic membrane, ear canal and external ear normal.     Nose:  Congestion present.     Mouth/Throat:     Mouth: Mucous membranes are moist.     Pharynx: Oropharynx is clear. Posterior oropharyngeal erythema present.  Eyes:     General: No scleral icterus.       Right eye: No discharge.  Left eye: No discharge.     Conjunctiva/sclera: Conjunctivae normal.  Cardiovascular:     Rate and Rhythm: Normal rate and regular rhythm.     Heart sounds: Normal heart sounds.  Pulmonary:     Effort: Pulmonary effort is normal. No respiratory distress.     Breath sounds: Normal breath sounds.  Musculoskeletal:     Cervical back: Neck supple.  Lymphadenopathy:     Cervical: Cervical adenopathy present.  Skin:    General: Skin is dry.  Neurological:     General: No focal deficit present.     Mental Status: She is alert. Mental status is at baseline.     Motor: No weakness.     Gait: Gait normal.  Psychiatric:        Mood and Affect: Mood normal.        Behavior: Behavior normal.        Thought Content: Thought content normal.      UC Treatments / Results  Labs (all labs ordered are listed, but only abnormal results are displayed) Labs Reviewed  GROUP A STREP BY PCR    EKG   Radiology No results found.  Procedures Procedures (including critical care time)  Medications Ordered in UC Medications - No data to display  Initial Impression / Assessment and Plan / UC Course  I have reviewed the triage vital signs and the nursing notes.  Pertinent labs & imaging results that were available during my care of the patient were reviewed by me and considered in my medical decision making (see chart for details).   67 year old female presents for sore throat, congestion, sinus pressure and cough x 1 week.  PCR strep test performed. Negative.  Reviewed results of strep test with patient.  Viral URI. Reviewed supportive care with increasing rest and fluids and use OTC ibuprofen, Tylenol, Chloraseptic spray, Mucinex, nasal saline and Flonase.  Return if fever or worsening symptoms.    Final Clinical Impressions(s) / UC Diagnoses   Final diagnoses:  Viral upper respiratory tract infection  Sore throat  Sinus pressure     Discharge Instructions      -Negative strep.  Likely a cold.  Try Mucinex and Flonase.  URI/COLD SYMPTOMS: Your exam today is consistent with a viral illness. Antibiotics are not indicated at this time. Use medications as directed, including cough syrup, nasal saline, and decongestants. Your symptoms should improve over the next few days and resolve within 7-10 days. Increase rest and fluids. F/u if symptoms worsen or predominate such as sore throat, ear pain, productive cough, shortness of breath, or if you develop high fevers or worsening fatigue over the next several days.       ED Prescriptions   None    PDMP not reviewed this encounter.   Shirlee Latch, PA-C 05/13/23 1348

## 2023-05-13 NOTE — ED Triage Notes (Signed)
Pt c/o facial pain, neck pain x1week  Pt states that she has had bloody cough this morning and feels better than she did last night.

## 2023-05-13 NOTE — Discharge Instructions (Signed)
-  Negative strep.  Likely a cold.  Try Mucinex and Flonase.  URI/COLD SYMPTOMS: Your exam today is consistent with a viral illness. Antibiotics are not indicated at this time. Use medications as directed, including cough syrup, nasal saline, and decongestants. Your symptoms should improve over the next few days and resolve within 7-10 days. Increase rest and fluids. F/u if symptoms worsen or predominate such as sore throat, ear pain, productive cough, shortness of breath, or if you develop high fevers or worsening fatigue over the next several days.

## 2023-05-22 ENCOUNTER — Ambulatory Visit
Admission: EM | Admit: 2023-05-22 | Discharge: 2023-05-22 | Disposition: A | Discharge status: Home / Self Care | Payer: Medicare PPO

## 2023-05-22 DIAGNOSIS — R21 Rash and other nonspecific skin eruption: Secondary | ICD-10-CM

## 2023-05-22 DIAGNOSIS — L255 Unspecified contact dermatitis due to plants, except food: Secondary | ICD-10-CM

## 2023-05-22 MED ORDER — METHYLPREDNISOLONE ACETATE 40 MG/ML IJ SUSP
40.0000 mg | Freq: Once | INTRAMUSCULAR | Status: AC
  Administered 2023-05-22: 40 mg via INTRAMUSCULAR

## 2023-05-22 NOTE — ED Triage Notes (Signed)
Patient here today with c/o itchy rash on both arms X 2 weeks after pulling weeds. Patient thinks she may have gotten into poison ivy. She had a virtual visit last week and was prescribed an ointment which seemed to be helping but now it is spreading again. She has also been taking Benadryl.

## 2023-05-22 NOTE — Discharge Instructions (Addendum)
-  We have given you an injection of a corticosteroid in the clinic. - Continue using triamcinolone ointment twice daily.

## 2023-05-22 NOTE — ED Provider Notes (Signed)
[facility]    CSN: [id number] Arrival date & time: 05/22/23  1217      History   Chief Complaint Chief Complaint  Patient presents with   Rash    HPI Vanessa Hampton is a 67 y.o. female presenting for rash of bilateral forearms for the past couple weeks.  She says symptoms started after she was out in her yard picking weeds.  States she had telemedicine appointment 5 days ago and was given triamcinolone 0.5% ointment which she has been using twice daily.  States it looked like it was drying up until today and now looks a little bit more inflamed.  Denies any associated pain or fever.  No new exposure.  Cannot think of anything else that could have caused her symptoms other than potential poisonous plants.  HPI  Past Medical History:  Diagnosis Date   Anemia    h/o   Anxiety    Arthritis    right hip   Asthma    well controlled   Cellulitis    Coronary artery disease    Diabetes mellitus without complication (HCC)    GERD (gastroesophageal reflux disease)    Helicobacter pylori (H. pylori) infection    Hypercholesteremia    Hypertension     Patient Active Problem List   Diagnosis Date Noted   Angina pectoris (HCC)    Diabetes type 2, uncontrolled    Anxiety disorder    Hyperlipidemia    Pain in the chest    Chest pain 08/30/2015    Past Surgical History:  Procedure Laterality Date   ABDOMINAL HYSTERECTOMY     APPENDECTOMY     BREAST CYST EXCISION Right    BREAST CYST EXCISION Left    CHOLECYSTECTOMY     LIPOMA EXCISION     back of head and chest area   SHOULDER ARTHROSCOPY WITH OPEN ROTATOR CUFF REPAIR Right 06/29/2020   Procedure: Right shoulder arthroscopy with debridement, decompression, repair of a massive rotator cuff tear, and biceps tenodesis.;  Surgeon: Christena Flake, MD;  Location: [facility];  Service: Orthopedics;  Laterality: Right;    OB History   No obstetric history on file.      Home Medications    Prior to  Admission medications   Medication Sig Start Date End Date Taking? Authorizing Provider  albuterol (PROVENTIL HFA;VENTOLIN HFA) 108 (90 Base) MCG/ACT inhaler Inhale 2 puffs into the lungs every 6 (six) hours as needed for wheezing. 12/30/16  Yes Renford Dills, NP  cetirizine (ZYRTEC) 10 MG tablet Take 1 tablet (10 mg total) by mouth daily. Patient taking differently: Take 10 mg by mouth every morning. 12/20/18  Yes Cook, Jayce G, DO  ibuprofen (ADVIL) 200 MG tablet Take 600 mg by mouth every 6 (six) hours as needed.   Yes [provider]  olmesartan-hydrochlorothiazide (BENICAR HCT) 40-25 MG tablet Take 1 tablet by mouth daily. 09/10/20 05/22/23 Yes [provider]  Olopatadine HCl 0.2 % SOLN Apply 1 drop to eye daily. 07/27/22  Yes Domenick Gong, MD  omeprazole (PRILOSEC) 20 MG capsule Take 20 mg by mouth 2 (two) times daily before a meal.   Yes [provider]  Semaglutide (OZEMPIC, 0.25 OR 0.5 MG/DOSE, [place]) Inject 0.25 mg into the skin every 7 (seven) days.   Yes [provider]  sertraline (ZOLOFT) 50 MG tablet Take 50 mg by mouth daily.   Yes [provider]  fluticasone furoate-vilanterol (BREO ELLIPTA) 200-25 MCG/INH AEPB Inhale 1 puff  into the lungs every morning.  02/21/17   [provider]  meclizine (ANTIVERT) 25 MG tablet Take 1 tablet (25 mg total) by mouth 3 (three) times daily as needed for dizziness. 08/18/21   Becky Augusta, NP  atenolol-chlorthalidone (TENORETIC) 50-25 MG per tablet Take 1 tablet by mouth every morning.   10/17/20  [provider]  fluticasone (FLONASE) 50 MCG/ACT nasal spray Place 2 sprays into both nostrils daily. 11/29/17 03/08/21  Tommie Sams, DO  simvastatin (ZOCOR) 40 MG tablet Take 40 mg by mouth at bedtime.  03/08/21  [provider]    Family History Family History  Problem Relation Age of Onset   Cancer Mother    Cancer Father    Breast cancer Neg Hx     Social History Social  History   Tobacco Use   Smoking status: Former    Years: 15    Types: Cigarettes   Smokeless tobacco: Never  Vaping Use   Vaping Use: Never used  Substance Use Topics   Alcohol use: No   Drug use: No     Allergies   Shellfish allergy, Aspirin, and Lisinopril   Review of Systems Review of Systems  Constitutional:  Negative for fatigue and fever.  Musculoskeletal:  Negative for arthralgias and joint swelling.  Skin:  Positive for rash.     Physical Exam Triage Vital Signs ED Triage Vitals  Enc Vitals Group     BP 05/22/23 1226 (!) 176/94     Pulse Rate 05/22/23 1226 89     Resp 05/22/23 1226 16     Temp 05/22/23 1226 98 F (36.7 C)     Temp Source 05/22/23 1226 Oral     SpO2 05/22/23 1226 94 %     Weight 05/22/23 1224 222 lb (100.7 kg)     Height 05/22/23 1224 5\' 3"  (1.6 m)     Head Circumference --      Peak Flow --      Pain Score 05/22/23 1224 0     Pain Loc --      Pain Edu? --      Excl. in GC? --    No data found.  Updated Vital Signs BP (!) 176/94 (BP Location: Right Arm)   Pulse 89   Temp 98 F (36.7 C) (Oral)   Resp 16   Ht 5\' 3"  (1.6 m)   Wt 222 lb (100.7 kg)   SpO2 94%   BMI 39.33 kg/m     Physical Exam Vitals and nursing note reviewed.  Constitutional:      General: She is not in acute distress.    Appearance: Normal appearance. She is not ill-appearing or toxic-appearing.  HENT:     Head: Normocephalic and atraumatic.  Eyes:     General: No scleral icterus.       Right eye: No discharge.        Left eye: No discharge.     Conjunctiva/sclera: Conjunctivae normal.  Cardiovascular:     Rate and Rhythm: Normal rate and regular rhythm.  Pulmonary:     Effort: Pulmonary effort is normal. No respiratory distress.  Musculoskeletal:     Cervical back: Neck supple.  Skin:    Findings: Rash (There are 2 patches of vesicles on erythemaous base of bilateral ventral forearms and a few scattered vesicles on wrists) present.  Neurological:      General: No focal deficit present.     Mental Status: She is alert. Mental  status is at baseline.     Motor: No weakness.     Gait: Gait normal.  Psychiatric:        Mood and Affect: Mood normal.        Behavior: Behavior normal.        Thought Content: Thought content normal.      UC Treatments / Results  Labs (all labs ordered are listed, but only abnormal results are displayed) Labs Reviewed - No data to display  EKG   Radiology No results found.  Procedures Procedures (including critical care time)  Medications Ordered in UC Medications  methylPREDNISolone acetate (DEPO-MEDROL) injection 40 mg (has no administration in time range)    Initial Impression / Assessment and Plan / UC Course  I have reviewed the triage vital signs and the nursing notes.  Pertinent labs & imaging results that were available during my care of the patient were reviewed by me and considered in my medical decision making (see chart for details).   67 year old female presents for poison ivy rash of bilateral ventral forearms for the past 2 weeks.  Has been using triamcinolone 0.5% ointment twice daily for the past 5 days which initially provided some relief but now she is worried it is flared up.  On exam she has 2 patches of vesicles on erythematous base and a few scattered vesicles on wrists.  We discussed use of systemic corticosteroids but I informed her that it can elevate her blood pressure and her blood sugar.  BP is already elevated at 176/94.  She reports that she is very uncomfortable and would like to have a corticosteroid injection.  Patient given 40 mg IM Depo-Medrol in clinic and advised to continue using triamcinolone ointment.  Reviewed monitoring blood pressure and blood sugar.  Reviewed return precautions.   Final Clinical Impressions(s) / UC Diagnoses   Final diagnoses:  Contact dermatitis due to plants, except food, unspecified contact dermatitis type  Rash and nonspecific  skin eruption     Discharge Instructions      -We have given you an injection of a corticosteroid in the clinic. - Continue using triamcinolone ointment twice daily.    ED Prescriptions   None    PDMP not reviewed this encounter.   Shirlee Latch, PA-C 05/22/23 1249

## 2023-07-17 ENCOUNTER — Other Ambulatory Visit: Payer: Self-pay | Admitting: Family Medicine

## 2023-07-17 DIAGNOSIS — Z1231 Encounter for screening mammogram for malignant neoplasm of breast: Secondary | ICD-10-CM

## 2023-09-03 ENCOUNTER — Ambulatory Visit
Admission: RE | Admit: 2023-09-03 | Discharge: 2023-09-03 | Disposition: A | Discharge status: Home / Self Care | Payer: Medicare PPO | Source: Ambulatory Visit | Attending: Family Medicine | Admitting: Family Medicine

## 2023-09-03 DIAGNOSIS — Z1231 Encounter for screening mammogram for malignant neoplasm of breast: Secondary | ICD-10-CM | POA: Diagnosis present

## 2023-10-03 ENCOUNTER — Encounter: Payer: Self-pay | Admitting: Gastroenterology

## 2023-11-23 ENCOUNTER — Encounter: Payer: Self-pay | Admitting: Gastroenterology

## 2023-12-06 ENCOUNTER — Encounter: Payer: Self-pay | Admitting: Gastroenterology

## 2023-12-13 ENCOUNTER — Other Ambulatory Visit: Payer: Self-pay | Admitting: Cardiology

## 2023-12-13 DIAGNOSIS — R0789 Other chest pain: Secondary | ICD-10-CM

## 2023-12-17 ENCOUNTER — Encounter
Admission: RE | Disposition: A | Discharge status: Home / Self Care | Payer: Self-pay | Source: Home / Self Care | Attending: Gastroenterology

## 2023-12-17 ENCOUNTER — Encounter: Payer: Self-pay | Admitting: Gastroenterology

## 2023-12-17 ENCOUNTER — Ambulatory Visit
Admission: RE | Admit: 2023-12-17 | Discharge: 2023-12-17 | Disposition: A | Discharge status: Home / Self Care | Payer: Medicare PPO | Attending: Gastroenterology | Admitting: Gastroenterology

## 2023-12-17 ENCOUNTER — Ambulatory Visit: Payer: Medicare PPO | Admitting: Certified Registered"

## 2023-12-17 DIAGNOSIS — E7800 Pure hypercholesterolemia, unspecified: Secondary | ICD-10-CM | POA: Diagnosis not present

## 2023-12-17 DIAGNOSIS — K219 Gastro-esophageal reflux disease without esophagitis: Secondary | ICD-10-CM | POA: Diagnosis not present

## 2023-12-17 DIAGNOSIS — K640 First degree hemorrhoids: Secondary | ICD-10-CM | POA: Diagnosis not present

## 2023-12-17 DIAGNOSIS — D124 Benign neoplasm of descending colon: Secondary | ICD-10-CM | POA: Insufficient documentation

## 2023-12-17 DIAGNOSIS — E119 Type 2 diabetes mellitus without complications: Secondary | ICD-10-CM | POA: Diagnosis not present

## 2023-12-17 DIAGNOSIS — I25119 Atherosclerotic heart disease of native coronary artery with unspecified angina pectoris: Secondary | ICD-10-CM | POA: Diagnosis not present

## 2023-12-17 DIAGNOSIS — Z7985 Long-term (current) use of injectable non-insulin antidiabetic drugs: Secondary | ICD-10-CM | POA: Insufficient documentation

## 2023-12-17 DIAGNOSIS — Z9049 Acquired absence of other specified parts of digestive tract: Secondary | ICD-10-CM | POA: Insufficient documentation

## 2023-12-17 DIAGNOSIS — Z7984 Long term (current) use of oral hypoglycemic drugs: Secondary | ICD-10-CM | POA: Insufficient documentation

## 2023-12-17 DIAGNOSIS — F419 Anxiety disorder, unspecified: Secondary | ICD-10-CM | POA: Diagnosis not present

## 2023-12-17 DIAGNOSIS — Z90710 Acquired absence of both cervix and uterus: Secondary | ICD-10-CM | POA: Diagnosis not present

## 2023-12-17 DIAGNOSIS — Q438 Other specified congenital malformations of intestine: Secondary | ICD-10-CM | POA: Insufficient documentation

## 2023-12-17 DIAGNOSIS — J45909 Unspecified asthma, uncomplicated: Secondary | ICD-10-CM | POA: Diagnosis not present

## 2023-12-17 DIAGNOSIS — I10 Essential (primary) hypertension: Secondary | ICD-10-CM | POA: Insufficient documentation

## 2023-12-17 DIAGNOSIS — K621 Rectal polyp: Secondary | ICD-10-CM | POA: Insufficient documentation

## 2023-12-17 DIAGNOSIS — Z1211 Encounter for screening for malignant neoplasm of colon: Secondary | ICD-10-CM | POA: Diagnosis present

## 2023-12-17 HISTORY — DX: Allergic rhinitis due to pollen: J30.1

## 2023-12-17 HISTORY — DX: Other seasonal allergic rhinitis: J30.2

## 2023-12-17 HISTORY — DX: Primary osteoarthritis, unspecified site: M19.91

## 2023-12-17 HISTORY — PX: COLONOSCOPY WITH PROPOFOL: SHX5780

## 2023-12-17 HISTORY — PX: POLYPECTOMY: SHX5525

## 2023-12-17 HISTORY — DX: Depression, unspecified: F32.A

## 2023-12-17 LAB — GLUCOSE, CAPILLARY: Glucose-Capillary: 85 mg/dL (ref 70–99)

## 2023-12-17 SURGERY — COLONOSCOPY WITH PROPOFOL
Anesthesia: General

## 2023-12-17 MED ORDER — PROPOFOL 10 MG/ML IV BOLUS
INTRAVENOUS | Status: AC
  Filled 2023-12-17: qty 20

## 2023-12-17 MED ORDER — PHENYLEPHRINE HCL-NACL 20-0.9 MG/250ML-% IV SOLN
INTRAVENOUS | Status: AC
  Filled 2023-12-17: qty 250

## 2023-12-17 MED ORDER — LIDOCAINE HCL (CARDIAC) PF 100 MG/5ML IV SOSY
PREFILLED_SYRINGE | INTRAVENOUS | Status: DC | PRN
  Administered 2023-12-17: 100 mg via INTRAVENOUS

## 2023-12-17 MED ORDER — PROPOFOL 10 MG/ML IV BOLUS
INTRAVENOUS | Status: AC
  Filled 2023-12-17: qty 40

## 2023-12-17 MED ORDER — PROPOFOL 500 MG/50ML IV EMUL
INTRAVENOUS | Status: DC | PRN
  Administered 2023-12-17: 110 ug/kg/min via INTRAVENOUS
  Administered 2023-12-17: 80 mg via INTRAVENOUS

## 2023-12-17 MED ORDER — SODIUM CHLORIDE 0.9 % IV SOLN: INTRAVENOUS | Status: DC

## 2023-12-17 MED ORDER — LIDOCAINE HCL (PF) 2 % IJ SOLN
INTRAMUSCULAR | Status: AC
  Filled 2023-12-17: qty 5

## 2023-12-17 NOTE — Op Note (Signed)
 [facility] Patient Name: Vanessa Hampton Procedure Date: 12/17/2023 8:44 AM MRN: [id number] Account #: [id number] Date of Birth: [birth year] Admit Type: Outpatient Age: 68 Room: [facility] ENDO ROOM 2 Gender: Female Note Status: Finalized Instrument Name: Colonoscope [id number] Procedure:             Colonoscopy Indications:           High risk colon cancer surveillance: Personal history                         of colonic polyps Providers:             Elspeth Ozell Onita ROSALEA, DO Referring MD:          Elspeth Ozell Onita DO, DO (Referring MD) Medicines:             Monitored Anesthesia Care Complications:         No immediate complications. Estimated blood loss:                         Minimal. Procedure:             Pre-Anesthesia Assessment:                        - Prior to the procedure, a History and Physical was                         performed, and patient medications and allergies were                         reviewed. The patient is competent. The risks and                         benefits of the procedure and the sedation options and                         risks were discussed with the patient. All questions                         were answered and informed consent was obtained.                         Patient identification and proposed procedure were                         verified by the physician, the nurse, the anesthetist                         and the technician in the endoscopy suite. Mental                         Status Examination: alert and oriented. Airway                         Examination: normal oropharyngeal airway and neck                         mobility. Respiratory Examination: clear to  auscultation. CV Examination: RRR, no murmurs, no S3                         or S4. Prophylactic Antibiotics: The patient does not                         require prophylactic antibiotics. Prior                          Anticoagulants: The patient has taken no anticoagulant                         or antiplatelet agents. ASA Grade Assessment: III - A                         patient with severe systemic disease. After reviewing                         the risks and benefits, the patient was deemed in                         satisfactory condition to undergo the procedure. The                         anesthesia plan was to use monitored anesthesia care                         (MAC). Immediately prior to administration of                         medications, the patient was re-assessed for adequacy                         to receive sedatives. The heart rate, respiratory                         rate, oxygen saturations, blood pressure, adequacy of                         pulmonary ventilation, and response to care were                         monitored throughout the procedure. The physical                         status of the patient was re-assessed after the                         procedure.                        After obtaining informed consent, the colonoscope was                         passed under direct vision. Throughout the procedure,                         the patient's blood pressure, pulse, and oxygen  saturations were monitored continuously. The                         Colonoscope was introduced through the anus and                         advanced to the the cecum, identified by the                         appendiceal orifice, ileocecal valve and palpation.                         The colonoscopy was somewhat difficult due to                         significant looping and the patient's body habitus.                         Successful completion of the procedure was aided by                         straightening and shortening the scope to obtain bowel                         loop reduction, using scope torsion, applying                         abdominal  pressure, lavage and receiving assistance                         from additional staff. The patient tolerated the                         procedure well. The quality of the bowel preparation                         was evaluated using the BBPS [facility] Bowel Preparation                         Scale) with scores of: Right Colon = 2 (minor amount                         of residual staining, small fragments of stool and/or                         opaque liquid, but mucosa seen well), Transverse Colon                         = 3 (entire mucosa seen well with no residual                         staining, small fragments of stool or opaque liquid)                         and Left Colon = 3 (entire mucosa seen well with no  residual staining, small fragments of stool or opaque                         liquid). The total BBPS score equals 8. The quality of                         the bowel preparation was excellent. The ileocecal                         valve, appendiceal orifice, and rectum were                         photographed. Findings:      The perianal and digital rectal examinations were normal. Pertinent       negatives include normal sphincter tone.      Two sessile polyps were found in the rectum and descending colon. The       polyps were 2 to 4 mm in size. These polyps were removed with a cold       snare. Resection and retrieval were complete. Estimated blood loss was       minimal.      Non-bleeding internal hemorrhoids were found during retroflexion. The       hemorrhoids were Grade I (internal hemorrhoids that do not prolapse).       Estimated blood loss: none.      The exam was otherwise without abnormality on direct and retroflexion       views. Impression:            - Two 2 to 4 mm polyps in the rectum and in the                         descending colon, removed with a cold snare. Resected                         and retrieved.                         - Non-bleeding internal hemorrhoids.                        - The examination was otherwise normal on direct and                         retroflexion views. Recommendation:        - Patient has a contact number available for                         emergencies. The signs and symptoms of potential                         delayed complications were discussed with the patient.                         Return to normal activities tomorrow. Written                         discharge instructions were provided to the patient.                        -  Discharge patient to home.                        - Resume previous diet.                        - Continue present medications.                        - No ibuprofen, naproxen , or other non-steroidal                         anti-inflammatory drugs for 5 days after polyp removal.                        - Await pathology results.                        - Repeat colonoscopy for surveillance based on                         pathology results.                        - Return to referring physician as previously                         scheduled.                        - The findings and recommendations were discussed with                         the patient. Procedure Code(s):     --- Professional ---                        [phone], Colonoscopy, flexible; with removal of                         tumor(s), polyp(s), or other lesion(s) by snare                         technique Diagnosis Code(s):     --- Professional ---                        Z86.010, Personal history of colonic polyps                        D12.8, Benign neoplasm of rectum                        D12.4, Benign neoplasm of descending colon                        K64.0, First degree hemorrhoids CPT copyright 2022 American Medical Association. All rights reserved. The codes documented in this report are preliminary and upon coder review may  be revised to meet current compliance  requirements. Attending Participation:      I personally performed the entire procedure. Elspeth Jungling, DO Elspeth Ozell Jungling DO, DO 12/17/2023 9:50:44 AM This report has been signed electronically. Number of Addenda: 0 Note Initiated On: 12/17/2023  8:44 AM Scope Withdrawal Time: 0 hours 16 minutes 39 seconds  Total Procedure Duration: 0 hours 36 minutes 51 seconds  Estimated Blood Loss:  Estimated blood loss was minimal.      [facility]

## 2023-12-17 NOTE — Transfer of Care (Signed)
 Immediate Anesthesia Transfer of Care Note  Patient: Vanessa Hampton  Procedure(s) Performed: COLONOSCOPY WITH PROPOFOL  POLYPECTOMY  Patient Location: Endoscopy Unit  Anesthesia Type:General  Level of Consciousness: drowsy  Airway & Oxygen Therapy: Patient Spontanous Breathing  Post-op Assessment: Report given to RN and Post -op Vital signs reviewed and stable  Post vital signs: Reviewed and stable  Last Vitals:  Vitals Value Taken Time  BP 114/72 12/17/23 0950  Temp 35.8 C 12/17/23 0950  Pulse 65 12/17/23 0951  Resp 14 12/17/23 0951  SpO2 100 % 12/17/23 0951  Vitals shown include unfiled device data.  Last Pain:  Vitals:   12/17/23 0950  TempSrc: Tympanic  PainSc: Asleep         Complications: No notable events documented.

## 2023-12-17 NOTE — H&P (Signed)
 Pre-Procedure H&P   Patient ID: Vanessa Hampton is a 68 y.o. female.  Gastroenterology Provider: Elspeth Ozell Jungling, DO  Referring Provider: Dr. Alla PCP: Alla Amis, MD  Date: 12/17/2023  HPI Ms. Vanessa Hampton is a 68 y.o. female who presents today for Colonoscopy for personal history of colon polyps .  Last colonoscopy 07/2017- [facility]; 08/2007 [facility]  A1c 6.4 cr 0.7 hgb 12 mcv 86 plt 280K  On ozempic which has been held for this procedure  S/p appendectomy, hysterectomy, cholecystectomy   Past Medical History:  Diagnosis Date   Anemia    h/o   Anxiety    Arthritis    right hip   Asthma    well controlled   Cellulitis    Chronic seasonal allergic rhinitis due to pollen    Coronary artery disease    Depression    Diabetes mellitus without complication (HCC)    GERD (gastroesophageal reflux disease)    Helicobacter pylori (H. pylori) infection    Hypercholesteremia    Hypertension    Primary osteoarthritis    Seasonal allergic rhinitis     Past Surgical History:  Procedure Laterality Date   ABDOMINAL HYSTERECTOMY     APPENDECTOMY     BREAST CYST EXCISION Right    BREAST CYST EXCISION Left    CHOLECYSTECTOMY     LIPOMA EXCISION     back of head and chest area   SHOULDER ARTHROSCOPY WITH OPEN ROTATOR CUFF REPAIR Right 06/29/2020   Procedure: Right shoulder arthroscopy with debridement, decompression, repair of a massive rotator cuff tear, and biceps tenodesis.;  Surgeon: Edie Norleen PARAS, MD;  Location: [facility] ORS;  Service: Orthopedics;  Laterality: Right;    Family History No h/o GI disease or malignancy  Review of Systems  Constitutional:  Negative for activity change, appetite change, chills, diaphoresis, fatigue, fever and unexpected weight change.  HENT:  Negative for trouble swallowing and voice change.   Respiratory:  Negative for shortness of breath and wheezing.   Cardiovascular:  Negative for chest pain, palpitations and  leg swelling.  Gastrointestinal:  Negative for abdominal distention, abdominal pain, anal bleeding, blood in stool, constipation, diarrhea, nausea, rectal pain and vomiting.  Musculoskeletal:  Negative for arthralgias and myalgias.  Skin:  Negative for color change and pallor.  Neurological:  Negative for dizziness, syncope and weakness.  Psychiatric/Behavioral:  Negative for confusion.   All other systems reviewed and are negative.    Medications No current facility-administered medications on file prior to encounter.   Current Outpatient Medications on File Prior to Encounter  Medication Sig Dispense Refill   albuterol  (PROVENTIL  HFA;VENTOLIN  HFA) 108 (90 Base) MCG/ACT inhaler Inhale 2 puffs into the lungs every 6 (six) hours as needed for wheezing. 1 Inhaler 0   atorvastatin  (LIPITOR) 40 MG tablet Take 40 mg by mouth daily.     busPIRone (BUSPAR) 5 MG tablet Take 5 mg by mouth 3 (three) times daily.     diphenhydrAMINE (BENADRYL) 25 mg capsule Take 25 mg by mouth every 6 (six) hours as needed for itching.     fluticasone  furoate-vilanterol (BREO ELLIPTA) 200-25 MCG/INH AEPB Inhale 1 puff into the lungs every morning.      ibuprofen (ADVIL) 200 MG tablet Take 600 mg by mouth every 6 (six) hours as needed.     levocetirizine (XYZAL) 5 MG tablet Take 5 mg by mouth every evening.     metformin (FORTAMET) 500 MG (OSM) 24 hr tablet Take 500 mg by  mouth daily with breakfast.     metoprolol  tartrate (LOPRESSOR ) 25 MG tablet Take 25 mg by mouth 2 (two) times daily.     olmesartan-hydrochlorothiazide (BENICAR HCT) 40-25 MG tablet Take 1 tablet by mouth daily.     omeprazole (PRILOSEC) 20 MG capsule Take 20 mg by mouth 2 (two) times daily before a meal.     sertraline (ZOLOFT) 50 MG tablet Take 50 mg by mouth daily.     cetirizine  (ZYRTEC ) 10 MG tablet Take 1 tablet (10 mg total) by mouth daily. (Patient taking differently: Take 10 mg by mouth every morning.) 14 tablet 0   meclizine  (ANTIVERT ) 25  MG tablet Take 1 tablet (25 mg total) by mouth 3 (three) times daily as needed for dizziness. 30 tablet 0   Olopatadine  HCl 0.2 % SOLN Apply 1 drop to eye daily. 2.5 mL 0   Semaglutide (OZEMPIC, 0.25 OR 0.5 MG/DOSE, Wheeler) Inject 0.25 mg into the skin every 7 (seven) days.     [DISCONTINUED] atenolol -chlorthalidone (TENORETIC) 50-25 MG per tablet Take 1 tablet by mouth every morning.      [DISCONTINUED] fluticasone  (FLONASE ) 50 MCG/ACT nasal spray Place 2 sprays into both nostrils daily. 16 g 0   [DISCONTINUED] simvastatin (ZOCOR) 40 MG tablet Take 40 mg by mouth at bedtime.      Pertinent medications related to GI and procedure were reviewed by me with the patient prior to the procedure   Current Facility-Administered Medications:    0.9 %  sodium chloride  infusion, , Intravenous, Continuous, Onita Elspeth Sharper, DO, Last Rate: 20 mL/hr at 12/17/23 0837, New Bag at 12/17/23 0837  sodium chloride  20 mL/hr at 12/17/23 9162       Allergies  Allergen Reactions   Shellfish Allergy Hives   Ace Inhibitors Cough   Aspirin Hives   Lisinopril Cough   Allergies were reviewed by me prior to the procedure  Objective   Body mass index is 40.57 kg/m. Vitals:   12/17/23 0811 12/17/23 0828  BP:  (!) 172/89  Pulse:  72  Resp:  16  Temp:  (!) 96.3 F (35.7 C)  TempSrc:  Temporal  SpO2:  100%  Weight: 103.9 kg      Physical Exam Vitals and nursing note reviewed.  Constitutional:      General: She is not in acute distress.    Appearance: Normal appearance. She is obese. She is not ill-appearing, toxic-appearing or diaphoretic.  HENT:     Head: Normocephalic and atraumatic.     Nose: Nose normal.     Mouth/Throat:     Mouth: Mucous membranes are moist.     Pharynx: Oropharynx is clear.  Eyes:     General: No scleral icterus.    Extraocular Movements: Extraocular movements intact.  Cardiovascular:     Rate and Rhythm: Normal rate and regular rhythm.     Heart sounds: Normal heart  sounds. No murmur heard.    No friction rub. No gallop.  Pulmonary:     Effort: Pulmonary effort is normal. No respiratory distress.     Breath sounds: Normal breath sounds. No wheezing, rhonchi or rales.  Abdominal:     General: Bowel sounds are normal. There is no distension.     Palpations: Abdomen is soft.     Tenderness: There is no abdominal tenderness. There is no guarding or rebound.  Musculoskeletal:     Cervical back: Neck supple.     Right lower leg: No edema.  Left lower leg: No edema.  Skin:    General: Skin is warm and dry.     Coloration: Skin is not jaundiced or pale.  Neurological:     General: No focal deficit present.     Mental Status: She is alert and oriented to person, place, and time. Mental status is at baseline.  Psychiatric:        Mood and Affect: Mood normal.        Behavior: Behavior normal.        Thought Content: Thought content normal.        Judgment: Judgment normal.      Assessment:  Ms. Vanessa Hampton is a 68 y.o. female  who presents today for Colonoscopy for personal history of colon polyps .  Plan:  Colonoscopy with possible intervention today  Colonoscopy with possible biopsy, control of bleeding, polypectomy, and interventions as necessary has been discussed with the patient/patient representative. Informed consent was obtained from the patient/patient representative after explaining the indication, nature, and risks of the procedure including but not limited to death, bleeding, perforation, missed neoplasm/lesions, cardiorespiratory compromise, and reaction to medications. Opportunity for questions was given and appropriate answers were provided. Patient/patient representative has verbalized understanding is amenable to undergoing the procedure.   Elspeth Ozell Jungling, DO  [facility]  Portions of the record may have been created with voice recognition software. Occasional wrong-word or 'sound-a-like'  substitutions may have occurred due to the inherent limitations of voice recognition software.  Read the chart carefully and recognize, using context, where substitutions may have occurred.

## 2023-12-17 NOTE — Interval H&P Note (Signed)
 History and Physical Interval Note: Preprocedure H&P from 12/17/23  was reviewed and there was no interval change after seeing and examining the patient.  Written consent was obtained from the patient after discussion of risks, benefits, and alternatives. Patient has consented to proceed with Colonoscopy with possible intervention   12/17/2023 8:58 AM  Vanessa Hampton  has presented today for surgery, with the diagnosis of Z86.010 (ICD-10-CM) - History of adenomatous polyp of colon.  The various methods of treatment have been discussed with the patient and family. After consideration of risks, benefits and other options for treatment, the patient has consented to  Procedure(s): COLONOSCOPY WITH PROPOFOL  (N/A) as a surgical intervention.  The patient's history has been reviewed, patient examined, no change in status, stable for surgery.  I have reviewed the patient's chart and labs.  Questions were answered to the patient's satisfaction.     Elspeth Ozell Jungling

## 2023-12-17 NOTE — Anesthesia Preprocedure Evaluation (Signed)
 Anesthesia Evaluation  Patient identified by MRN, date of birth, ID band Patient awake    Reviewed: Allergy & Precautions, H&P , NPO status , Patient's Chart, lab work & pertinent test results, reviewed documented beta blocker date and time   Airway Mallampati: II   Neck ROM: full    Dental  (+) Poor Dentition   Pulmonary asthma , former smoker   Pulmonary exam normal        Cardiovascular Exercise Tolerance: Poor hypertension, On Medications + angina  + CAD  Normal cardiovascular exam Rhythm:regular Rate:Normal     Neuro/Psych  PSYCHIATRIC DISORDERS Anxiety Depression    negative neurological ROS     GI/Hepatic Neg liver ROS,GERD  Medicated,,  Endo/Other  negative endocrine ROSdiabetes, Well Controlled    Renal/GU negative Renal ROS  negative genitourinary   Musculoskeletal   Abdominal   Peds  Hematology  (+) Blood dyscrasia, anemia   Anesthesia Other Findings Past Medical History: No date: Anemia     Comment:  h/o No date: Anxiety No date: Arthritis     Comment:  right hip No date: Asthma     Comment:  well controlled No date: Cellulitis No date: Chronic seasonal allergic rhinitis due to pollen No date: Coronary artery disease No date: Depression No date: Diabetes mellitus without complication (HCC) No date: GERD (gastroesophageal reflux disease) No date: Helicobacter pylori (H. pylori) infection No date: Hypercholesteremia No date: Hypertension No date: Primary osteoarthritis No date: Seasonal allergic rhinitis Past Surgical History: No date: ABDOMINAL HYSTERECTOMY No date: APPENDECTOMY No date: BREAST CYST EXCISION; Right No date: BREAST CYST EXCISION; Left No date: CHOLECYSTECTOMY No date: LIPOMA EXCISION     Comment:  back of head and chest area 06/29/2020: SHOULDER ARTHROSCOPY WITH OPEN ROTATOR CUFF REPAIR; Right     Comment:  Procedure: Right shoulder arthroscopy with debridement,                decompression, repair of a massive rotator cuff tear, and              biceps tenodesis.;  Surgeon: Edie Norleen PARAS, MD;                Location: [facility] ORS;  Service: Orthopedics;  Laterality:               Right; BMI    Body Mass Index: 40.57 kg/m     Reproductive/Obstetrics negative OB ROS                             Anesthesia Physical Anesthesia Plan  ASA: 3  Anesthesia Plan: General   Post-op Pain Management:    Induction:   PONV Risk Score and Plan:   Airway Management Planned:   Additional Equipment:   Intra-op Plan:   Post-operative Plan:   Informed Consent: I have reviewed the patients History and Physical, chart, labs and discussed the procedure including the risks, benefits and alternatives for the proposed anesthesia with the patient or authorized representative who has indicated his/her understanding and acceptance.     Dental Advisory Given  Plan Discussed with: CRNA  Anesthesia Plan Comments:        Anesthesia Quick Evaluation

## 2023-12-18 LAB — SURGICAL PATHOLOGY

## 2023-12-19 ENCOUNTER — Telehealth (HOSPITAL_COMMUNITY): Payer: Self-pay | Admitting: *Deleted

## 2023-12-19 NOTE — Anesthesia Postprocedure Evaluation (Signed)
 Anesthesia Post Note  Patient: Vanessa Hampton  Procedure(s) Performed: COLONOSCOPY WITH PROPOFOL  POLYPECTOMY  Patient location during evaluation: PACU Anesthesia Type: General Level of consciousness: awake and alert Pain management: pain level controlled Vital Signs Assessment: post-procedure vital signs reviewed and stable Respiratory status: spontaneous breathing, nonlabored ventilation, respiratory function stable and patient connected to nasal cannula oxygen Cardiovascular status: blood pressure returned to baseline and stable Postop Assessment: no apparent nausea or vomiting Anesthetic complications: no   No notable events documented.   Last Vitals:  Vitals:   12/17/23 1000 12/17/23 1010  BP: 135/87 (!) 160/81  Pulse: 72 69  Resp: 20 17  Temp:    SpO2: 99% 99%    Last Pain:  Vitals:   12/18/23 0742  TempSrc:   PainSc: 0-No pain                 Zula Hitch

## 2023-12-19 NOTE — Telephone Encounter (Signed)
 Reaching out to patient to offer assistance regarding upcoming cardiac imaging study; pt verbalizes understanding of appt date/time, parking situation and where to check in, pre-test NPO status and medications ordered, and verified current allergies; name and call back number provided for further questions should they arise Johney Frame RN Navigator Cardiac Imaging [facility] [phone] office [phone] cell

## 2023-12-19 NOTE — Telephone Encounter (Signed)
 Attempted to call patient regarding upcoming cardiac CT appointment. Left message on voicemail with name and callback number Johney Frame RN Navigator Cardiac Imaging [facility] [phone] Office

## 2023-12-20 ENCOUNTER — Ambulatory Visit
Admission: RE | Admit: 2023-12-20 | Discharge: 2023-12-20 | Disposition: A | Discharge status: Home / Self Care | Payer: Medicare PPO | Source: Ambulatory Visit | Attending: Cardiology | Admitting: Cardiology

## 2023-12-20 DIAGNOSIS — I2510 Atherosclerotic heart disease of native coronary artery without angina pectoris: Secondary | ICD-10-CM | POA: Insufficient documentation

## 2023-12-20 DIAGNOSIS — R0789 Other chest pain: Secondary | ICD-10-CM | POA: Insufficient documentation

## 2023-12-20 LAB — POCT I-STAT, CHEM 8
BUN: 14 mg/dL (ref 8–23)
Calcium, Ion: 1.22 mmol/L (ref 1.15–1.40)
Chloride: 102 mmol/L (ref 98–111)
Creatinine, Ser: 0.8 mg/dL (ref 0.44–1.00)
Glucose, Bld: 101 mg/dL — ABNORMAL HIGH (ref 70–99)
HCT: 39 % (ref 36.0–46.0)
Hemoglobin: 13.3 g/dL (ref 12.0–15.0)
Potassium: 3.5 mmol/L (ref 3.5–5.1)
Sodium: 139 mmol/L (ref 135–145)
TCO2: 24 mmol/L (ref 22–32)

## 2023-12-20 MED ORDER — DILTIAZEM HCL 25 MG/5ML IV SOLN: 10.0000 mg | INTRAVENOUS | Status: DC | PRN

## 2023-12-20 MED ORDER — METOPROLOL TARTRATE 5 MG/5ML IV SOLN: 10.0000 mg | Freq: Once | INTRAVENOUS | Status: DC | PRN

## 2023-12-20 MED ORDER — NITROGLYCERIN 0.4 MG SL SUBL
0.8000 mg | SUBLINGUAL_TABLET | Freq: Once | SUBLINGUAL | Status: AC
Start: 2023-12-20 — End: 2023-12-20
  Administered 2023-12-20: 0.8 mg via SUBLINGUAL

## 2023-12-20 MED ORDER — SODIUM CHLORIDE 0.9 % IV SOLN: INTRAVENOUS | Status: DC

## 2023-12-20 MED ORDER — IOHEXOL 350 MG/ML SOLN
100.0000 mL | Freq: Once | INTRAVENOUS | Status: AC | PRN
  Administered 2023-12-20: 100 mL via INTRAVENOUS

## 2023-12-20 NOTE — Progress Notes (Signed)
Patient tolerated procedure well. Ambulate w/o difficulty. Denies light headedness or being dizzy. Sitting up drinking water provided. Encouraged to drink extra water today and reasoning explained. Verbalized understanding. All questions answered. ABC intact. No further needs. Discharge from procedure area w/o issues.

## 2024-01-10 ENCOUNTER — Ambulatory Visit: Payer: Medicare PPO | Admitting: Dietician

## 2024-01-24 IMAGING — CR DG KNEE COMPLETE 4+V*L*
4 series · 4 of 4 positions shown · non-contrast
Comparison: None Available.

CLINICAL DATA: Acute left knee pain and swelling.

EXAM:
LEFT KNEE - COMPLETE 4+ VIEW

[knee ap]
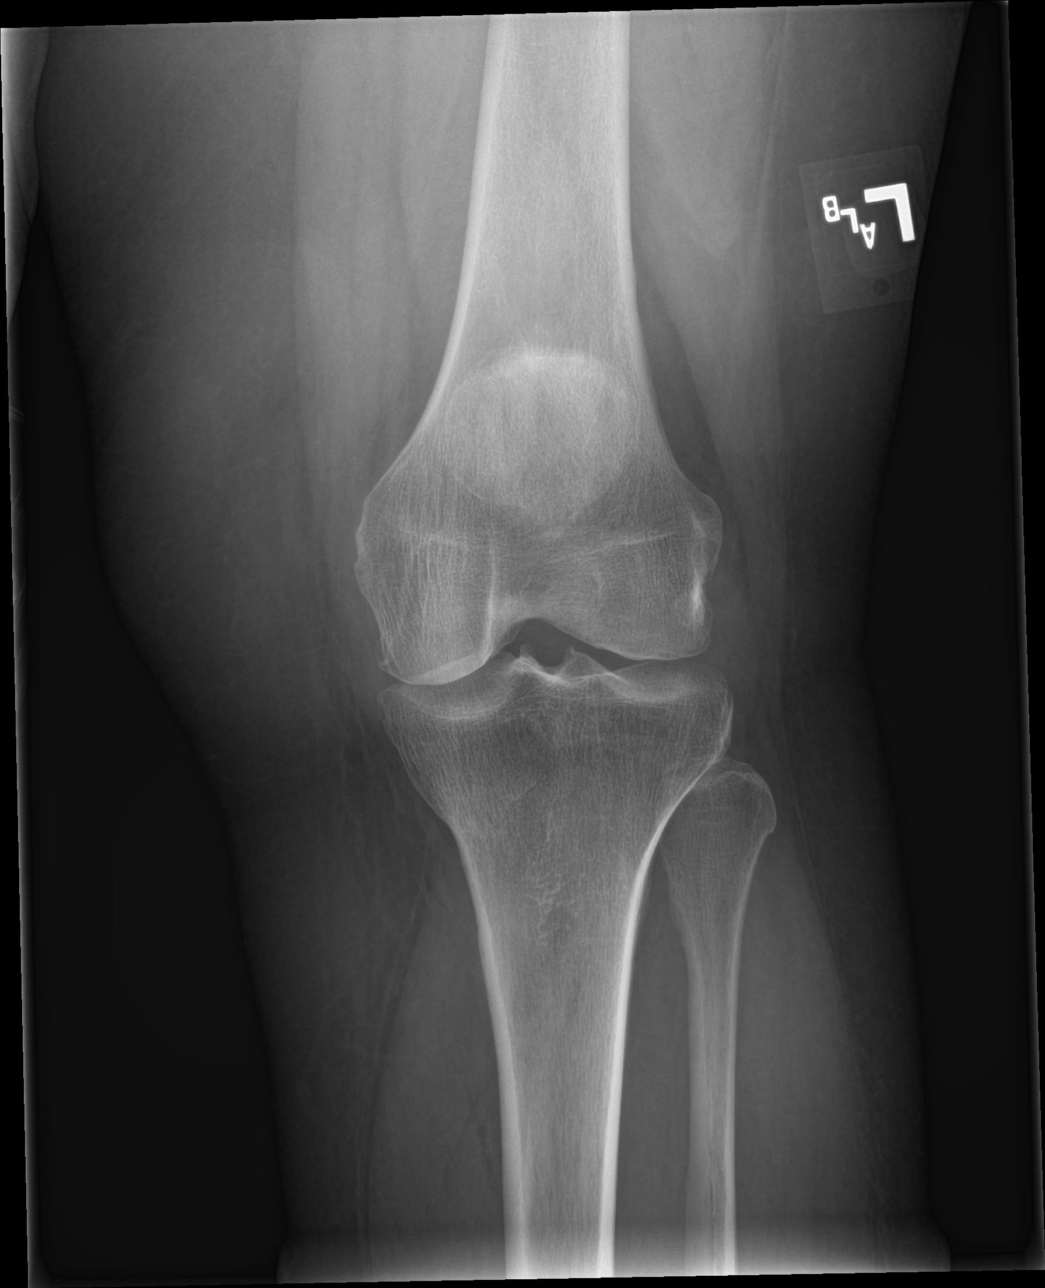

[knee lat]
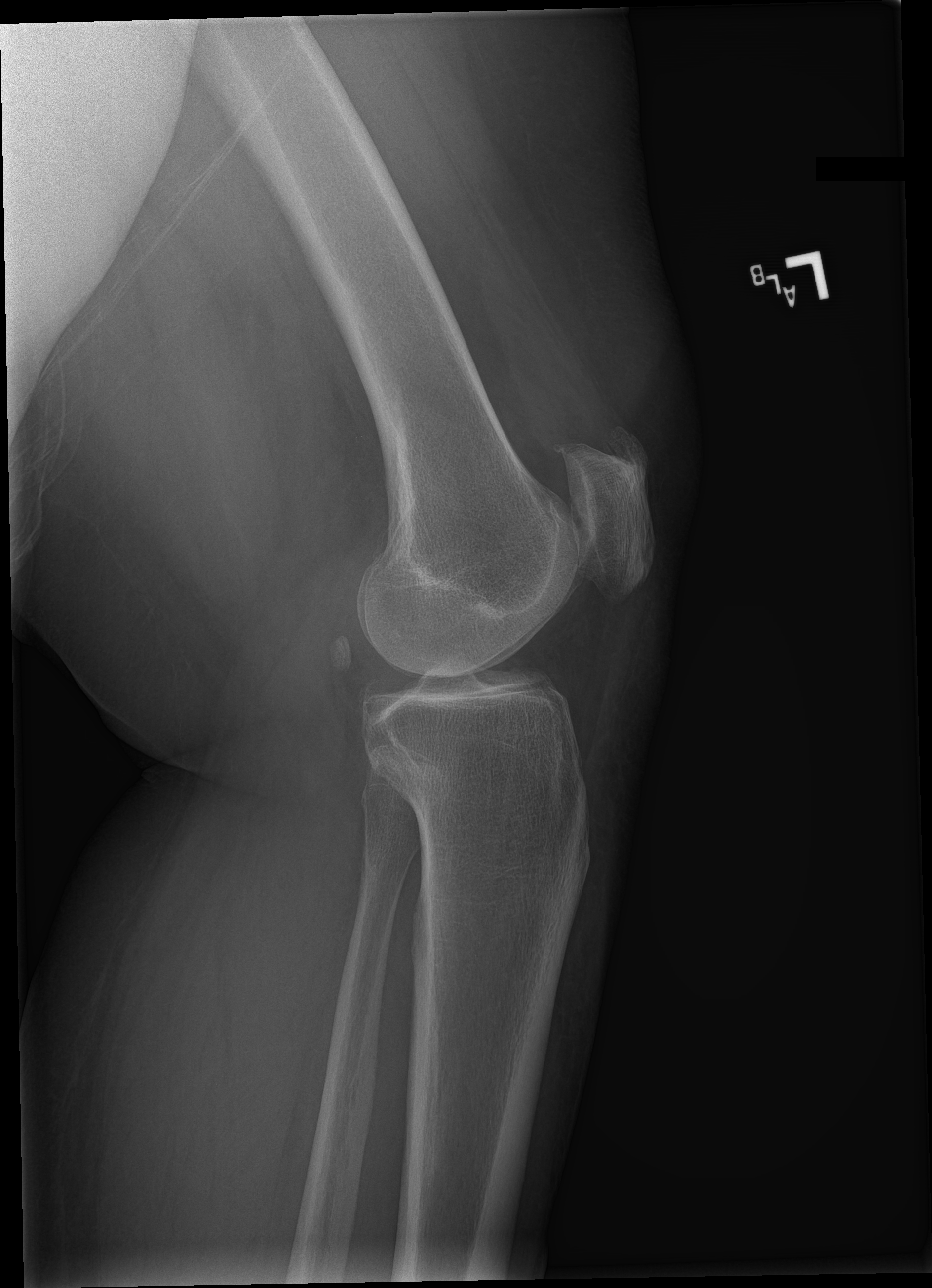

[tunnel]
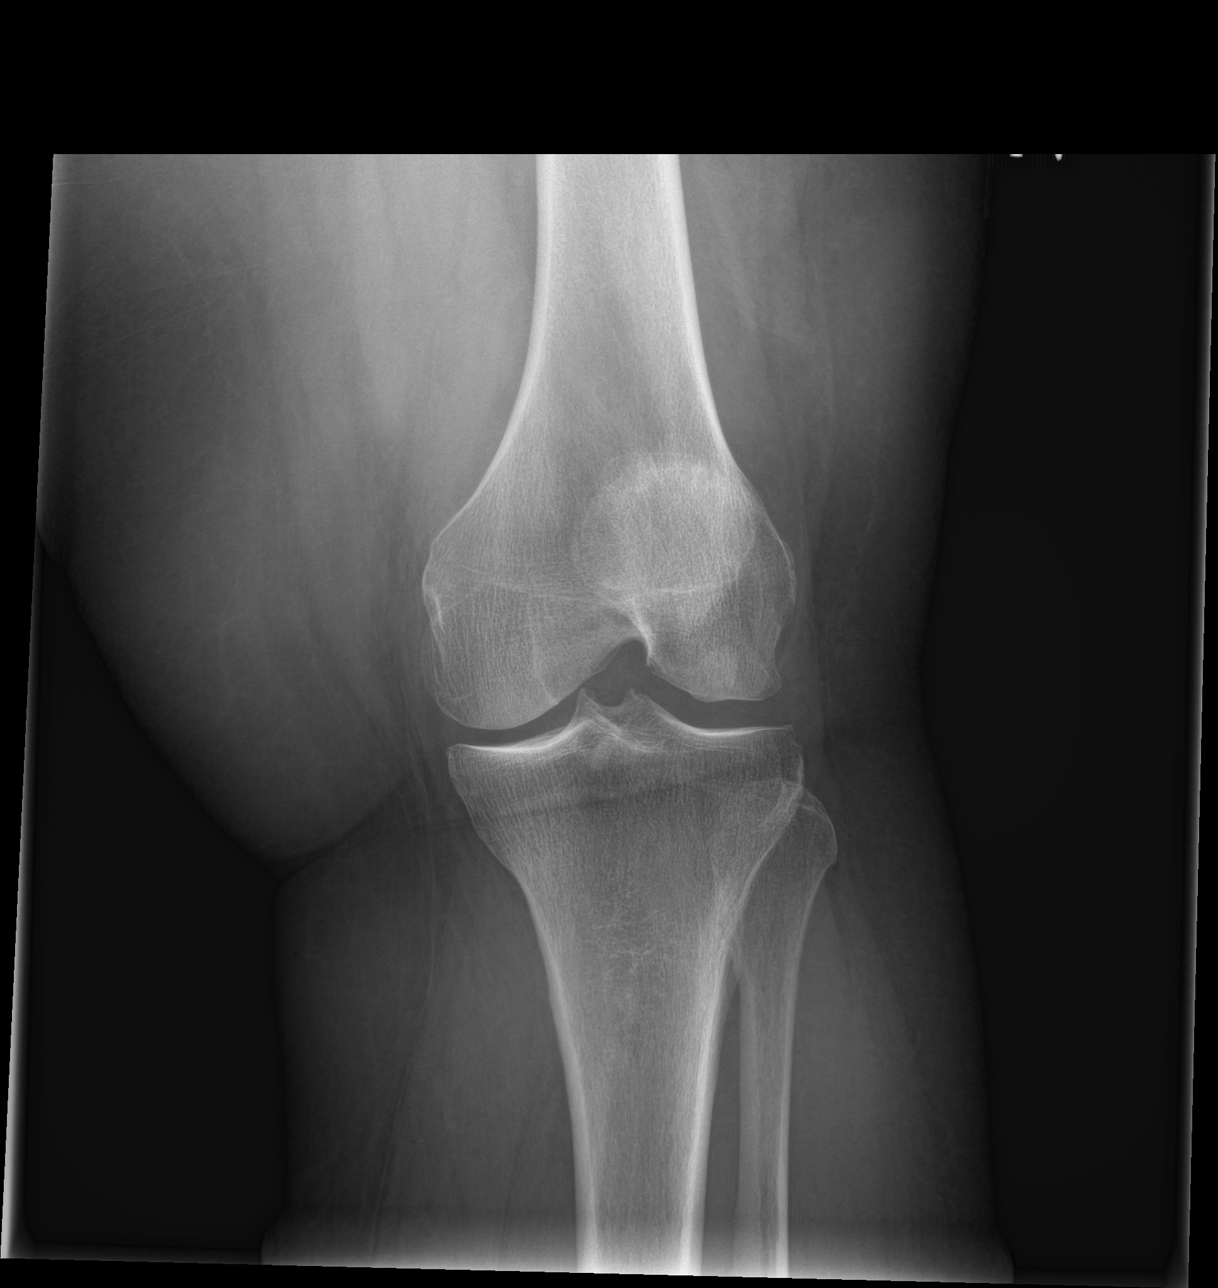

[patella skyline]
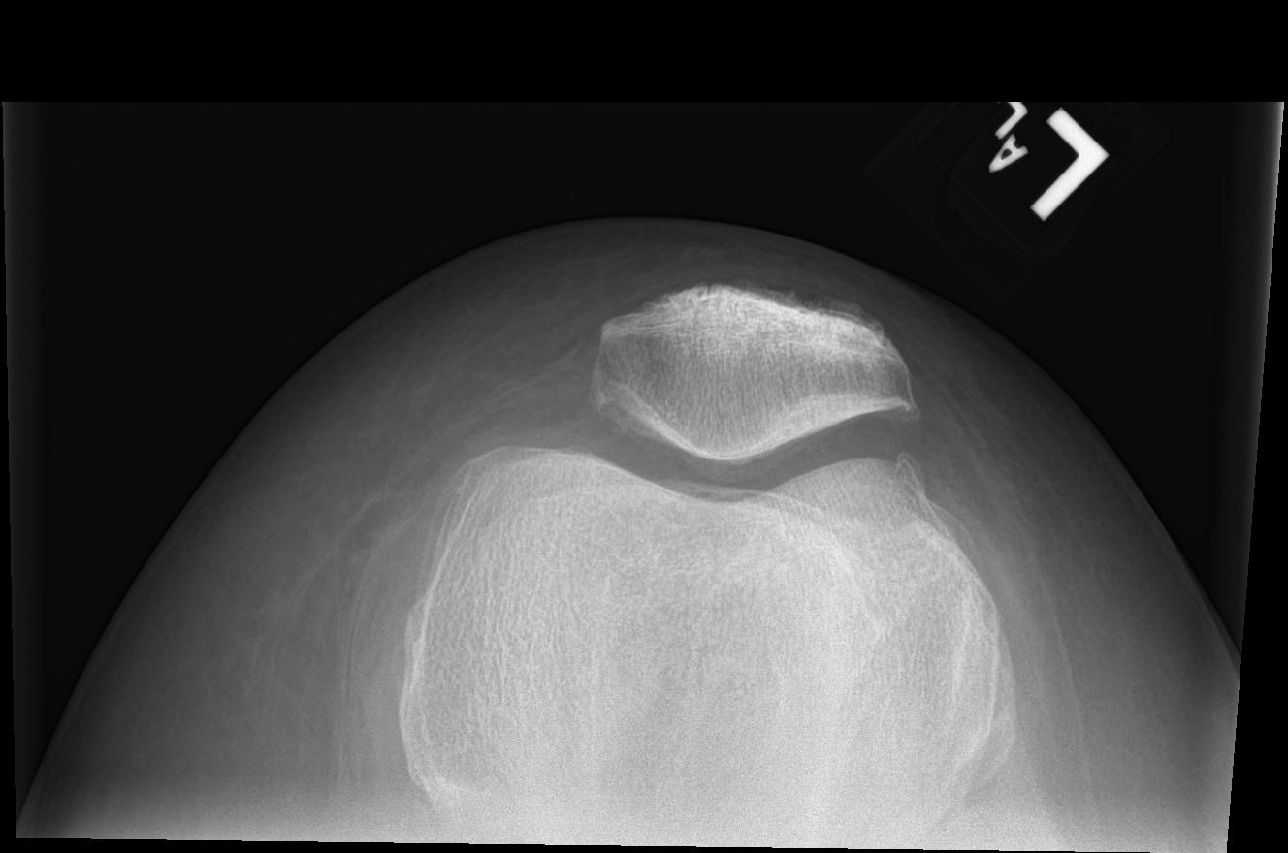

[4 of 4 positions shown; findings below may reference images not displayed]

FINDINGS: No evidence of fracture, dislocation, or joint effusion. Mild
narrowing of medial joint space is noted. Mild patellar spurring is
noted. Soft tissues are unremarkable.
IMPRESSION: Mild degenerative joint disease is noted medially. No acute
abnormality seen.

## 2024-06-10 ENCOUNTER — Encounter: Payer: Self-pay | Admitting: Emergency Medicine

## 2024-06-10 ENCOUNTER — Ambulatory Visit
Admission: EM | Admit: 2024-06-10 | Discharge: 2024-06-10 | Disposition: A | Discharge status: Home / Self Care | Attending: Emergency Medicine | Admitting: Emergency Medicine

## 2024-06-10 DIAGNOSIS — L03311 Cellulitis of abdominal wall: Secondary | ICD-10-CM

## 2024-06-10 DIAGNOSIS — R21 Rash and other nonspecific skin eruption: Secondary | ICD-10-CM | POA: Diagnosis not present

## 2024-06-10 MED ORDER — HYDROCORTISONE 1 % EX CREA: TOPICAL_CREAM | CUTANEOUS | 0 refills | Status: DC

## 2024-06-10 MED ORDER — DOXYCYCLINE HYCLATE 100 MG PO CAPS: 100.0000 mg | ORAL_CAPSULE | Freq: Two times a day (BID) | ORAL | 0 refills | Status: AC

## 2024-06-10 NOTE — ED Provider Notes (Signed)
 [facility]    CSN: [id number] Arrival date & time: 06/10/24  1122      History   Chief Complaint Chief Complaint  Patient presents with   Rash    HPI Vanessa Hampton is a 68 y.o. female.   HPI  24 old female with past medical history significant for CAD, diabetes, hypertension, high cholesterol, anxiety, cellulitis, H. pylori infection, anemia, arthritis, asthma, pression, chronic seasonal allergic rhinitis, and osteoarthritis presents for evaluation of rash and bumps on her body that she noticed 4 days ago.  She reports that she went to [facility] and she stayed in a hotel.  Her sister stayed the same room that she did.  Her sister did have some similar lesions on her left upper arm.  The patient has lesions in her hairline, left upper arm, left neck, and abdomen.  She describes the areas as itchy and sore.  The spot on her abdomen is more erythematous, tender, and warm to touch.  She denies fever.  Past Medical History:  Diagnosis Date   Anemia    h/o   Anxiety    Arthritis    right hip   Asthma    well controlled   Cellulitis    Chronic seasonal allergic rhinitis due to pollen    Coronary artery disease    Depression    Diabetes mellitus without complication (HCC)    GERD (gastroesophageal reflux disease)    Helicobacter pylori (H. pylori) infection    Hypercholesteremia    Hypertension    Primary osteoarthritis    Seasonal allergic rhinitis     Patient Active Problem List   Diagnosis Date Noted   Angina pectoris (HCC)    Diabetes type 2, uncontrolled    Anxiety disorder    Hyperlipidemia    Pain in the chest    Chest pain 08/30/2015    Past Surgical History:  Procedure Laterality Date   ABDOMINAL HYSTERECTOMY     APPENDECTOMY     BREAST CYST EXCISION Right    BREAST CYST EXCISION Left    CHOLECYSTECTOMY     COLONOSCOPY WITH PROPOFOL  N/A 12/17/2023   Procedure: COLONOSCOPY WITH PROPOFOL ;  Surgeon: Onita Elspeth Sharper, DO;   Location: [facility] ENDOSCOPY;  Service: Gastroenterology;  Laterality: N/A;   LIPOMA EXCISION     back of head and chest area   POLYPECTOMY  12/17/2023   Procedure: POLYPECTOMY;  Surgeon: Onita Elspeth Sharper, DO;  Location: [facility] ENDOSCOPY;  Service: Gastroenterology;;   SHOULDER ARTHROSCOPY WITH OPEN ROTATOR CUFF REPAIR Right 06/29/2020   Procedure: Right shoulder arthroscopy with debridement, decompression, repair of a massive rotator cuff tear, and biceps tenodesis.;  Surgeon: Edie Norleen PARAS, MD;  Location: [facility] ORS;  Service: Orthopedics;  Laterality: Right;    OB History   No obstetric history on file.      Home Medications    Prior to Admission medications   Medication Sig Start Date End Date Taking? Authorizing Provider  doxycycline  (VIBRAMYCIN ) 100 MG capsule Take 1 capsule (100 mg total) by mouth 2 (two) times daily for 7 days. 06/10/24 06/17/24 Yes Bernardino Ditch, NP  hydrocortisone  cream 1 % Apply to affected area 2 times daily 06/10/24  Yes Bernardino Ditch, NP  albuterol  (PROVENTIL  HFA;VENTOLIN  HFA) 108 (90 Base) MCG/ACT inhaler Inhale 2 puffs into the lungs every 6 (six) hours as needed for wheezing. 12/30/16   Cleotilde Jacobsen, NP  atorvastatin  (LIPITOR) 40 MG tablet Take 40 mg by mouth daily.  [provider]  busPIRone (BUSPAR) 5 MG tablet Take 5 mg by mouth 3 (three) times daily.    [provider]  cetirizine  (ZYRTEC ) 10 MG tablet Take 1 tablet (10 mg total) by mouth daily. Patient taking differently: Take 10 mg by mouth every morning. 12/20/18   Cook, Jayce G, DO  diphenhydrAMINE (BENADRYL) 25 mg capsule Take 25 mg by mouth every 6 (six) hours as needed for itching.    [provider]  fluticasone  furoate-vilanterol (BREO ELLIPTA) 200-25 MCG/INH AEPB Inhale 1 puff into the lungs every morning.  02/21/17   [provider]  ibuprofen (ADVIL) 200 MG tablet Take 600 mg by mouth every 6 (six) hours as needed.    [provider]  levocetirizine  (XYZAL) 5 MG tablet Take 5 mg by mouth every evening.    [provider]  meclizine  (ANTIVERT ) 25 MG tablet Take 1 tablet (25 mg total) by mouth 3 (three) times daily as needed for dizziness. 08/18/21   Bernardino Ditch, NP  metformin (FORTAMET) 500 MG (OSM) 24 hr tablet Take 500 mg by mouth daily with breakfast.    [provider]  metoprolol  tartrate (LOPRESSOR ) 25 MG tablet Take 25 mg by mouth 2 (two) times daily.    [provider]  olmesartan-hydrochlorothiazide (BENICAR HCT) 40-25 MG tablet Take 1 tablet by mouth daily. 09/10/20 12/17/23  [provider]  Olopatadine  HCl 0.2 % SOLN Apply 1 drop to eye daily. 07/27/22   Mortenson, Ashley, MD  omeprazole (PRILOSEC) 20 MG capsule Take 20 mg by mouth 2 (two) times daily before a meal.    [provider]  Semaglutide (OZEMPIC, 0.25 OR 0.5 MG/DOSE, [place]) Inject 0.25 mg into the skin every 7 (seven) days.    [provider]  sertraline (ZOLOFT) 50 MG tablet Take 50 mg by mouth daily.    [provider]  atenolol -chlorthalidone (TENORETIC) 50-25 MG per tablet Take 1 tablet by mouth every morning.   10/17/20  [provider]  fluticasone  (FLONASE ) 50 MCG/ACT nasal spray Place 2 sprays into both nostrils daily. 11/29/17 03/08/21  Cook, Jayce G, DO  simvastatin (ZOCOR) 40 MG tablet Take 40 mg by mouth at bedtime.  03/08/21  [provider]    Family History Family History  Problem Relation Age of Onset   Cancer Mother    Cancer Father    Breast cancer Neg Hx     Social History Social History   Tobacco Use   Smoking status: Former    Types: Cigarettes    Passive exposure: Past   Smokeless tobacco: Never  Vaping Use   Vaping status: Never Used  Substance Use Topics   Alcohol use: No   Drug use: No     Allergies   Shellfish allergy, Ace inhibitors, Aspirin, and Lisinopril   Review of Systems Review of Systems  Constitutional:  Negative for fever.  Skin:  Positive  for rash.     Physical Exam Triage Vital Signs ED Triage Vitals  Encounter Vitals Group     BP      Girls Systolic BP Percentile      Girls Diastolic BP Percentile      Boys Systolic BP Percentile      Boys Diastolic BP Percentile      Pulse      Resp      Temp      Temp src      SpO2      Weight  Height      Head Circumference      Peak Flow      Pain Score      Pain Loc      Pain Education      Exclude from Growth Chart    No data found.  Updated Vital Signs BP (!) 159/94 (BP Location: Right Wrist)   Pulse 74   Temp 98.4 F (36.9 C) (Oral)   Resp 18   SpO2 95%   Visual Acuity Right Eye Distance:   Left Eye Distance:   Bilateral Distance:    Right Eye Near:   Left Eye Near:    Bilateral Near:     Physical Exam Vitals and nursing note reviewed.  Constitutional:      Appearance: Normal appearance. She is not ill-appearing.  HENT:     Head: Normocephalic and atraumatic.  Skin:    General: Skin is warm and dry.     Capillary Refill: Capillary refill takes less than 2 seconds.     Findings: Erythema and rash present.  Neurological:     General: No focal deficit present.     Mental Status: She is alert and oriented to person, place, and time.      UC Treatments / Results  Labs (all labs ordered are listed, but only abnormal results are displayed) Labs Reviewed - No data to display  EKG   Radiology No results found.  Procedures Procedures (including critical care time)  Medications Ordered in UC Medications - No data to display  Initial Impression / Assessment and Plan / UC Course  I have reviewed the triage vital signs and the nursing notes.  Pertinent labs & imaging results that were available during my care of the patient were reviewed by me and considered in my medical decision making (see chart for details).   Patient is a pleasant, nontoxic-appearing 68 year old female presenting for evaluation of a body rash as outlined in HPI  above.          As you can see images above, the bumps appear to be insect bites.  The patient reports that she did wait in the water but only ankle did not and she did not bathe in the water.  She did utilize the hot tub but it was after the rash eruption presented.  Given that she was at [facility] that there is the potential for possible sea lice or sand flea bites.  There is also potential for bedbugs, the patient reports that she did search her bed before she slept in it.  The lesion on her abdomen is concerning for developing cellulitis given that it is erythematous, widespread, warm, and tender to touch.  I will treat her for nonspecific skin rash and cellulitis with a 7-day course of doxycycline .  I will also prescribe hydrocortisone  cream that she can apply twice daily to help with itching along with using over-the-counter antihistamines.   Final Clinical Impressions(s) / UC Diagnoses   Final diagnoses:  Rash and nonspecific skin eruption  Cellulitis of abdominal wall     Discharge Instructions      Use the hydrocortisone  cream twice daily on your rash to help with itching.  Do not use it for more than 1 week on your face.  Take the doxycycline  twice a day with food for 7 days to cover for potential secondary skin infection on your abdomen.  You may also use over-the-counter Claritin, Zyrtec , or Allegra during the day to help with  itching and over-the-counter Benadryl at bedtime.  Follow the package instructions for dosing.  If you have any increased redness, swelling, drainage from the rash, or you develop a fever please return for reevaluation or see your primary care provider     ED Prescriptions     Medication Sig Dispense Auth. Provider   doxycycline  (VIBRAMYCIN ) 100 MG capsule Take 1 capsule (100 mg total) by mouth 2 (two) times daily for 7 days. 14 capsule Bernardino Ditch, NP   hydrocortisone  cream 1 % Apply to affected area 2 times daily 28 g Bernardino Ditch, NP       PDMP not reviewed this encounter.   Bernardino Ditch, NP 06/10/24 1153

## 2024-06-10 NOTE — Discharge Instructions (Addendum)
 Use the hydrocortisone  cream twice daily on your rash to help with itching.  Do not use it for more than 1 week on your face.  Take the doxycycline  twice a day with food for 7 days to cover for potential secondary skin infection on your abdomen.  You may also use over-the-counter Claritin, Zyrtec , or Allegra during the day to help with itching and over-the-counter Benadryl at bedtime.  Follow the package instructions for dosing.  If you have any increased redness, swelling, drainage from the rash, or you develop a fever please return for reevaluation or see your primary care provider

## 2024-06-10 NOTE — ED Triage Notes (Signed)
 Pt presents with a rash/bumps several places all over her body x 4 days.

## 2024-10-03 ENCOUNTER — Other Ambulatory Visit: Payer: Self-pay | Admitting: Family Medicine

## 2024-10-03 DIAGNOSIS — Z1231 Encounter for screening mammogram for malignant neoplasm of breast: Secondary | ICD-10-CM

## 2024-10-26 ENCOUNTER — Encounter: Payer: Self-pay | Admitting: Emergency Medicine

## 2024-10-26 ENCOUNTER — Ambulatory Visit
Admission: EM | Admit: 2024-10-26 | Discharge: 2024-10-26 | Disposition: A | Discharge status: Home / Self Care | Attending: Emergency Medicine | Admitting: Emergency Medicine

## 2024-10-26 DIAGNOSIS — L089 Local infection of the skin and subcutaneous tissue, unspecified: Secondary | ICD-10-CM | POA: Diagnosis present

## 2024-10-26 DIAGNOSIS — L723 Sebaceous cyst: Secondary | ICD-10-CM | POA: Insufficient documentation

## 2024-10-26 DIAGNOSIS — N611 Abscess of the breast and nipple: Secondary | ICD-10-CM | POA: Insufficient documentation

## 2024-10-26 MED ORDER — AMOXICILLIN-POT CLAVULANATE 875-125 MG PO TABS: 1.0000 | ORAL_TABLET | Freq: Two times a day (BID) | ORAL | 0 refills | Status: DC

## 2024-10-26 MED ORDER — CHLORHEXIDINE GLUCONATE 4 % EX SOLN: Freq: Every day | CUTANEOUS | 0 refills | Status: DC | PRN

## 2024-10-26 NOTE — Discharge Instructions (Signed)
 Warm compresses.  Discontinue hydrogen peroxide and alcohol.  Keep this clean with soap and water.  Take ibuprofen 400 mg combined with 1000 mg of Tylenol  together 3-4 times a day, and finish the Augmentin , even if you feel better.  I have put in a referral to [facility] and [place], but I would recommend calling the [place] and  office to arrange a follow-up appointment in case the referral does not go through.

## 2024-10-26 NOTE — ED Triage Notes (Addendum)
 Pt has abscess on her right upper breast. She states she has a known cyst on her breast but recently gotten infected. She states she saw her PCP and was prescribed Doxy. She has not finished it but states she feels like the area is getting bigger. Denies fever.

## 2024-10-26 NOTE — ED Provider Notes (Incomplete)
 HPI  SUBJECTIVE:  Vanessa Hampton is a 68 y.o. female who presents with a painful mass on her right breast for the past 10 days, that is now draining odorous pus. Patient was seen by her PCP on 11/14, thought to have an infected sebaceous cyst right chest wall, and was sent home with doxycycline  twice daily for 10 days.  No fevers, body aches.  She states the erythema, swelling and pain is getting worse.  She is on day 8 or 9/10 of the doxycycline .  She has also been trying to keep it clean with hydrogen peroxide and alcohol without improvement in her symptoms.  No aggravating or alleviating factors. She has a past medical history of cellulitis, diabetes, hypercholesterolemia, hypertension, coronary artery disease, is status post bilateral breast cyst excision.  No history of MRSA, chronic kidney disease.  PCP: [facility] [place].  Past Medical History:  Diagnosis Date   Anemia    h/o   Anxiety    Arthritis    right hip   Asthma    well controlled   Cellulitis    Chronic seasonal allergic rhinitis due to pollen    Coronary artery disease    Depression    Diabetes mellitus without complication (HCC)    GERD (gastroesophageal reflux disease)    Helicobacter pylori (H. pylori) infection    Hypercholesteremia    Hypertension    Primary osteoarthritis    Seasonal allergic rhinitis     Past Surgical History:  Procedure Laterality Date   ABDOMINAL HYSTERECTOMY     APPENDECTOMY     BREAST CYST EXCISION Right    BREAST CYST EXCISION Left    CHOLECYSTECTOMY     COLONOSCOPY WITH PROPOFOL  N/A 12/17/2023   Procedure: COLONOSCOPY WITH PROPOFOL ;  Surgeon: Onita Elspeth Sharper, DO;  Location: [facility] ENDOSCOPY;  Service: Gastroenterology;  Laterality: N/A;   LIPOMA EXCISION     back of head and chest area   POLYPECTOMY  12/17/2023   Procedure: POLYPECTOMY;  Surgeon: Onita Elspeth Sharper, DO;  Location: [facility] ENDOSCOPY;  Service: Gastroenterology;;   SHOULDER ARTHROSCOPY WITH OPEN  ROTATOR CUFF REPAIR Right 06/29/2020   Procedure: Right shoulder arthroscopy with debridement, decompression, repair of a massive rotator cuff tear, and biceps tenodesis.;  Surgeon: Edie Norleen PARAS, MD;  Location: [facility] ORS;  Service: Orthopedics;  Laterality: Right;    Family History  Problem Relation Age of Onset   Cancer Mother    Cancer Father    Breast cancer Neg Hx     Social History   Tobacco Use   Smoking status: Former    Types: Cigarettes    Passive exposure: Past   Smokeless tobacco: Never  Vaping Use   Vaping status: Never Used  Substance Use Topics   Alcohol use: No   Drug use: No    No current facility-administered medications for this encounter.  Current Outpatient Medications:    amoxicillin -clavulanate (AUGMENTIN ) 875-125 MG tablet, Take 1 tablet by mouth every 12 (twelve) hours., Disp: 14 tablet, Rfl: 0   atorvastatin  (LIPITOR) 40 MG tablet, Take 40 mg by mouth daily., Disp: , Rfl:    busPIRone (BUSPAR) 5 MG tablet, Take 5 mg by mouth 3 (three) times daily., Disp: , Rfl:    chlorhexidine  (HIBICLENS ) 4 % external liquid, Apply topically daily as needed., Disp: 120 mL, Rfl: 0   diphenhydrAMINE (BENADRYL) 25 mg capsule, Take 25 mg by mouth every 6 (six) hours as needed for itching., Disp: , Rfl:    doxycycline  (  VIBRA -TABS) 100 MG tablet, Take 100 mg by mouth., Disp: , Rfl:    levocetirizine (XYZAL) 5 MG tablet, Take 5 mg by mouth every evening., Disp: , Rfl:    metformin (FORTAMET) 500 MG (OSM) 24 hr tablet, Take 500 mg by mouth daily with breakfast., Disp: , Rfl:    metoprolol  tartrate (LOPRESSOR ) 25 MG tablet, Take 25 mg by mouth 2 (two) times daily., Disp: , Rfl:    olmesartan-hydrochlorothiazide (BENICAR HCT) 40-25 MG tablet, Take 1 tablet by mouth daily., Disp: , Rfl:    omeprazole (PRILOSEC) 20 MG capsule, Take 20 mg by mouth 2 (two) times daily before a meal., Disp: , Rfl:    Semaglutide (OZEMPIC, 0.25 OR 0.5 MG/DOSE, [place]), Inject 0.25 mg into the skin every 7  (seven) days., Disp: , Rfl:    sertraline (ZOLOFT) 50 MG tablet, Take 50 mg by mouth daily., Disp: , Rfl:    albuterol  (PROVENTIL  HFA;VENTOLIN  HFA) 108 (90 Base) MCG/ACT inhaler, Inhale 2 puffs into the lungs every 6 (six) hours as needed for wheezing., Disp: 1 Inhaler, Rfl: 0   cetirizine  (ZYRTEC ) 10 MG tablet, Take 1 tablet (10 mg total) by mouth daily. (Patient taking differently: Take 10 mg by mouth every morning.), Disp: 14 tablet, Rfl: 0   fluticasone  furoate-vilanterol (BREO ELLIPTA) 200-25 MCG/INH AEPB, Inhale 1 puff into the lungs every morning. , Disp: , Rfl:    hydrocortisone  cream 1 %, Apply to affected area 2 times daily, Disp: 28 g, Rfl: 0   ibuprofen (ADVIL) 200 MG tablet, Take 600 mg by mouth every 6 (six) hours as needed., Disp: , Rfl:    meclizine  (ANTIVERT ) 25 MG tablet, Take 1 tablet (25 mg total) by mouth 3 (three) times daily as needed for dizziness., Disp: 30 tablet, Rfl: 0   Olopatadine  HCl 0.2 % SOLN, Apply 1 drop to eye daily., Disp: 2.5 mL, Rfl: 0  Allergies  Allergen Reactions   Shellfish Allergy Hives   Ace Inhibitors Cough   Aspirin Hives   Lisinopril Cough     ROS  As noted in HPI.   Physical Exam  BP (!) 146/86 (BP Location: Left Arm)   Pulse 73   Temp 97.9 F (36.6 C) (Oral)   Resp 16   Ht 5' 3 (1.6 m)   Wt 103.9 kg   SpO2 98%   BMI 40.58 kg/m   Constitutional: Well developed, well nourished, no acute distress Eyes:  EOMI, conjunctiva normal bilaterally HENT: Normocephalic, atraumatic,mucus membranes moist Respiratory: Normal inspiratory effort Cardiovascular: Normal rate GI: nondistended skin: 5 x 2 cm centimeter tender, erythematous lesion on outer quadrant right breast with expressible purulent drainage.  Chaperone present during exam     Lymph: Few small tender, mobile right sided axillary lymph nodes  musculoskeletal: no deformities Neurologic: Alert & oriented x 3, no focal neuro deficits Psychiatric: Speech and behavior  appropriate   ED Course   Medications - No data to display  Orders Placed This Encounter  Procedures   Aerobic Culture w Gram Stain (superficial specimen)    Standing Status:   Standing    Number of Occurrences:   1   Ambulatory referral to General Surgery    Referral Priority:   Routine    Referral Type:   Surgical    Referral Reason:   Specialty Services Required    Requested Specialty:   General Surgery    Number of Visits Requested:   1    No results found for this or  any previous visit (from the past 24 hours). No results found.  ED Clinical Impression  1. Infected sebaceous cyst   2. Breast abscess of female      ED Assessment/Plan     Outside records reviewed.  As noted in HPI.    Patient presents with an infected sebaceous cyst over her right breast and is not responding to doxycycline .  Sending off wound culture to confirm antibiotic choice.  Will send her home on Augmentin  875 mg p.o. twice daily for 7 days, ibuprofen 400 mg, 1000 mg of Tylenol  together 3-4 times a day, Hibiclens  soap and water.  Warm compresses.  Discontinue hydrogen peroxide and alcohol.  Will place routine surgery referral to have this excised when the infection resolves.  Advised her to also call [facility] in [place] or [place] to try and arrange an appointment in case referral does not go through. follow-up with PCP as needed.  ER return precautions given.  Discussed labsMDM, treatment plan, and plan for follow-up with patient. Discussed sn/sx that should prompt return to the ED. patient agrees with plan.   Meds ordered this encounter  Medications   chlorhexidine  (HIBICLENS ) 4 % external liquid    Sig: Apply topically daily as needed.    Dispense:  120 mL    Refill:  0   amoxicillin -clavulanate (AUGMENTIN ) 875-125 MG tablet    Sig: Take 1 tablet by mouth every 12 (twelve) hours.    Dispense:  14 tablet    Refill:  0      *This clinic note was created using Administrator, sports. Therefore, there may be occasional mistakes despite careful proofreading.  ?    Van Knee, MD 10/27/24 1003    Van Knee, MD 10/27/24 1004

## 2024-10-31 LAB — AEROBIC CULTURE W GRAM STAIN (SUPERFICIAL SPECIMEN): Gram Stain: NONE SEEN

## 2024-11-01 ENCOUNTER — Ambulatory Visit: Payer: Self-pay | Admitting: Emergency Medicine

## 2024-11-10 ENCOUNTER — Ambulatory Visit: Payer: Self-pay | Admitting: Surgery

## 2024-11-11 ENCOUNTER — Ambulatory Visit

## 2024-11-20 ENCOUNTER — Other Ambulatory Visit: Payer: Self-pay | Admitting: General Surgery

## 2024-11-20 DIAGNOSIS — L089 Local infection of the skin and subcutaneous tissue, unspecified: Secondary | ICD-10-CM

## 2024-11-20 DIAGNOSIS — N611 Abscess of the breast and nipple: Secondary | ICD-10-CM

## 2024-11-24 ENCOUNTER — Inpatient Hospital Stay: Admission: RE | Admit: 2024-11-24 | Discharge: 2024-11-24 | Attending: General Surgery

## 2024-11-24 ENCOUNTER — Ambulatory Visit
Admission: RE | Admit: 2024-11-24 | Discharge: 2024-11-24 | Disposition: A | Discharge status: Home / Self Care | Source: Ambulatory Visit | Attending: General Surgery | Admitting: General Surgery

## 2024-11-24 DIAGNOSIS — L089 Local infection of the skin and subcutaneous tissue, unspecified: Secondary | ICD-10-CM

## 2024-11-24 DIAGNOSIS — N611 Abscess of the breast and nipple: Secondary | ICD-10-CM | POA: Diagnosis present

## 2024-11-24 DIAGNOSIS — L720 Epidermal cyst: Secondary | ICD-10-CM | POA: Diagnosis present

## 2024-11-25 ENCOUNTER — Ambulatory Visit: Payer: Self-pay | Admitting: General Surgery

## 2024-12-17 ENCOUNTER — Ambulatory Visit: Admit: 2024-12-17 | Admitting: General Surgery

## 2025-01-02 ENCOUNTER — Inpatient Hospital Stay: Admission: RE | Admit: 2025-01-02 | Source: Ambulatory Visit

## 2025-01-08 ENCOUNTER — Ambulatory Visit: Payer: Self-pay | Admitting: General Surgery

## 2025-01-09 ENCOUNTER — Encounter: Payer: Self-pay | Admitting: Urgent Care

## 2025-01-09 ENCOUNTER — Ambulatory Visit: Admission: RE | Admit: 2025-01-09 | Source: Home / Self Care | Admitting: General Surgery

## 2025-01-09 ENCOUNTER — Encounter: Admission: RE | Payer: Self-pay | Source: Home / Self Care

## 2025-01-09 SURGERY — EXCISION, CYST, BREAST
Anesthesia: General | Site: Breast | Laterality: Right

## 2025-01-12 ENCOUNTER — Inpatient Hospital Stay: Admission: RE | Admit: 2025-01-12

## 2025-01-16 ENCOUNTER — Encounter: Admission: RE | Payer: Self-pay

## 2025-01-16 ENCOUNTER — Ambulatory Visit: Admission: RE | Admit: 2025-01-16 | Admitting: General Surgery
